# Patient Record
Sex: Female | Born: 1975 | Race: Black or African American | Hispanic: No | Marital: Single | Smoking: Current every day smoker
Health system: Southern US, Community
[De-identification: ages and names within clinical notes are randomized; demographics above are authoritative.]

## PROBLEM LIST (undated history)

## (undated) DIAGNOSIS — J45909 Unspecified asthma, uncomplicated: Secondary | ICD-10-CM

## (undated) DIAGNOSIS — F909 Attention-deficit hyperactivity disorder, unspecified type: Secondary | ICD-10-CM

## (undated) DIAGNOSIS — F419 Anxiety disorder, unspecified: Secondary | ICD-10-CM

## (undated) DIAGNOSIS — K759 Inflammatory liver disease, unspecified: Secondary | ICD-10-CM

## (undated) DIAGNOSIS — B1920 Unspecified viral hepatitis C without hepatic coma: Secondary | ICD-10-CM

## (undated) DIAGNOSIS — F329 Major depressive disorder, single episode, unspecified: Secondary | ICD-10-CM

## (undated) DIAGNOSIS — F32A Depression, unspecified: Secondary | ICD-10-CM

## (undated) DIAGNOSIS — I10 Essential (primary) hypertension: Secondary | ICD-10-CM

---

## 2004-06-28 ENCOUNTER — Emergency Department: Payer: Self-pay | Admitting: Emergency Medicine

## 2004-06-30 ENCOUNTER — Emergency Department: Payer: Self-pay | Admitting: Emergency Medicine

## 2010-03-23 ENCOUNTER — Emergency Department: Payer: Self-pay | Admitting: Emergency Medicine

## 2010-07-30 ENCOUNTER — Encounter: Payer: Self-pay | Admitting: Family Medicine

## 2010-08-20 ENCOUNTER — Encounter: Payer: Self-pay | Admitting: Family Medicine

## 2011-03-09 ENCOUNTER — Emergency Department: Payer: Self-pay | Admitting: Unknown Physician Specialty

## 2012-01-25 ENCOUNTER — Emergency Department: Payer: Self-pay | Admitting: Emergency Medicine

## 2013-03-12 ENCOUNTER — Emergency Department: Payer: Self-pay | Admitting: Emergency Medicine

## 2013-03-12 LAB — URINALYSIS, COMPLETE
Bilirubin,UR: NEGATIVE
Glucose,UR: NEGATIVE mg/dL (ref 0–75)
Leukocyte Esterase: NEGATIVE
Nitrite: NEGATIVE
Ph: 5 (ref 4.5–8.0)
Protein: NEGATIVE
RBC,UR: <1 /HPF (ref 0–5)

## 2013-10-14 ENCOUNTER — Emergency Department: Payer: Self-pay | Admitting: Emergency Medicine

## 2014-05-02 ENCOUNTER — Emergency Department: Payer: Self-pay | Admitting: Internal Medicine

## 2015-07-23 ENCOUNTER — Other Ambulatory Visit: Payer: Self-pay | Admitting: Gastroenterology

## 2015-07-23 DIAGNOSIS — K739 Chronic hepatitis, unspecified: Secondary | ICD-10-CM

## 2015-07-29 ENCOUNTER — Ambulatory Visit: Payer: Medicaid Other | Admitting: Anesthesiology

## 2015-07-31 ENCOUNTER — Ambulatory Visit: Admission: RE | Admit: 2015-07-31 | Payer: Medicaid Other | Source: Ambulatory Visit

## 2015-07-31 ENCOUNTER — Ambulatory Visit
Admission: RE | Admit: 2015-07-31 | Discharge: 2015-07-31 | Disposition: A | Discharge status: Home / Self Care | Payer: Medicaid Other | Source: Ambulatory Visit | Attending: Gastroenterology | Admitting: Gastroenterology

## 2015-07-31 DIAGNOSIS — K739 Chronic hepatitis, unspecified: Secondary | ICD-10-CM | POA: Insufficient documentation

## 2015-07-31 DIAGNOSIS — R932 Abnormal findings on diagnostic imaging of liver and biliary tract: Secondary | ICD-10-CM | POA: Insufficient documentation

## 2015-08-27 ENCOUNTER — Ambulatory Visit: Payer: Medicaid Other | Admitting: Anesthesiology

## 2015-10-03 ENCOUNTER — Encounter: Payer: Self-pay | Admitting: *Deleted

## 2015-10-06 ENCOUNTER — Encounter
Admission: RE | Disposition: A | Discharge status: Home / Self Care | Payer: Self-pay | Source: Ambulatory Visit | Attending: Gastroenterology

## 2015-10-06 ENCOUNTER — Ambulatory Visit: Payer: Medicaid Other | Admitting: Anesthesiology

## 2015-10-06 ENCOUNTER — Encounter: Payer: Self-pay | Admitting: *Deleted

## 2015-10-06 ENCOUNTER — Ambulatory Visit
Admission: RE | Admit: 2015-10-06 | Discharge: 2015-10-06 | Disposition: A | Discharge status: Home / Self Care | Payer: Medicaid Other | Source: Ambulatory Visit | Attending: Gastroenterology | Admitting: Gastroenterology

## 2015-10-06 DIAGNOSIS — F419 Anxiety disorder, unspecified: Secondary | ICD-10-CM | POA: Diagnosis not present

## 2015-10-06 DIAGNOSIS — I10 Essential (primary) hypertension: Secondary | ICD-10-CM | POA: Insufficient documentation

## 2015-10-06 DIAGNOSIS — F329 Major depressive disorder, single episode, unspecified: Secondary | ICD-10-CM | POA: Diagnosis not present

## 2015-10-06 DIAGNOSIS — F909 Attention-deficit hyperactivity disorder, unspecified type: Secondary | ICD-10-CM | POA: Insufficient documentation

## 2015-10-06 DIAGNOSIS — Z79899 Other long term (current) drug therapy: Secondary | ICD-10-CM | POA: Diagnosis not present

## 2015-10-06 DIAGNOSIS — K7460 Unspecified cirrhosis of liver: Secondary | ICD-10-CM | POA: Insufficient documentation

## 2015-10-06 DIAGNOSIS — F17210 Nicotine dependence, cigarettes, uncomplicated: Secondary | ICD-10-CM | POA: Insufficient documentation

## 2015-10-06 HISTORY — PX: ESOPHAGOGASTRODUODENOSCOPY (EGD) WITH PROPOFOL: SHX5813

## 2015-10-06 HISTORY — DX: Anxiety disorder, unspecified: F41.9

## 2015-10-06 HISTORY — DX: Major depressive disorder, single episode, unspecified: F32.9

## 2015-10-06 HISTORY — DX: Depression, unspecified: F32.A

## 2015-10-06 HISTORY — DX: Essential (primary) hypertension: I10

## 2015-10-06 HISTORY — DX: Attention-deficit hyperactivity disorder, unspecified type: F90.9

## 2015-10-06 HISTORY — DX: Inflammatory liver disease, unspecified: K75.9

## 2015-10-06 LAB — POCT PREGNANCY, URINE: PREG TEST UR: NEGATIVE

## 2015-10-06 SURGERY — ESOPHAGOGASTRODUODENOSCOPY (EGD) WITH PROPOFOL
Anesthesia: General

## 2015-10-06 MED ORDER — MIDAZOLAM HCL 2 MG/2ML IJ SOLN
INTRAMUSCULAR | Status: DC | PRN
  Administered 2015-10-06: 1 mg via INTRAVENOUS

## 2015-10-06 MED ORDER — PROPOFOL 10 MG/ML IV BOLUS
INTRAVENOUS | Status: DC | PRN
  Administered 2015-10-06: 40 mg via INTRAVENOUS

## 2015-10-06 MED ORDER — GLYCOPYRROLATE 0.2 MG/ML IJ SOLN
INTRAMUSCULAR | Status: DC | PRN
  Administered 2015-10-06: 0.2 mg via INTRAVENOUS

## 2015-10-06 MED ORDER — PROPOFOL 500 MG/50ML IV EMUL
INTRAVENOUS | Status: DC | PRN
  Administered 2015-10-06: 100 ug/kg/min via INTRAVENOUS

## 2015-10-06 MED ORDER — SODIUM CHLORIDE 0.9 % IV SOLN
INTRAVENOUS | Status: DC
  Administered 2015-10-06: 13:00:00 via INTRAVENOUS

## 2015-10-06 NOTE — Anesthesia Preprocedure Evaluation (Signed)
Anesthesia Evaluation  Patient identified by MRN, date of birth, ID band Patient awake    Reviewed: Allergy & Precautions, H&P , NPO status , Patient's Chart, lab work & pertinent test results, reviewed documented beta blocker date and time   Airway Mallampati: III   Neck ROM: full    Dental  (+) Poor Dentition   Pulmonary neg pulmonary ROS, Current Smoker,    Pulmonary exam normal        Cardiovascular hypertension, negative cardio ROS Normal cardiovascular exam Rate:Normal     Neuro/Psych PSYCHIATRIC DISORDERS negative neurological ROS  negative psych ROS   GI/Hepatic negative GI ROS, Neg liver ROS, (+) Hepatitis -, C  Endo/Other  negative endocrine ROS  Renal/GU negative Renal ROS  negative genitourinary   Musculoskeletal   Abdominal   Peds  Hematology negative hematology ROS (+)   Anesthesia Other Findings Past Medical History:   Anxiety                                                      Depression                                                   Hepatitis                                                    Hypertension                                                 ADHD (attention deficit hyperactivity disorder)            History reviewed. No pertinent surgical history. BMI    Body Mass Index   40.66 kg/m 2     Reproductive/Obstetrics                             Anesthesia Physical Anesthesia Plan  ASA: III  Anesthesia Plan: General   Post-op Pain Management:    Induction:   Airway Management Planned:   Additional Equipment:   Intra-op Plan:   Post-operative Plan:   Informed Consent: I have reviewed the patients History and Physical, chart, labs and discussed the procedure including the risks, benefits and alternatives for the proposed anesthesia with the patient or authorized representative who has indicated his/her understanding and acceptance.   Dental  Advisory Given  Plan Discussed with: CRNA  Anesthesia Plan Comments:         Anesthesia Quick Evaluation

## 2015-10-06 NOTE — Discharge Instructions (Signed)

## 2015-10-06 NOTE — Op Note (Signed)
[facility] Patient Name: Andrea Cain Procedure Date: 10/06/2015 1:38 PM MRN: [id number] Account #: [id number] Date of Birth: 03-22-1976 Admit Type: Outpatient Age: 40 Room: [facility] ENDO ROOM 2 Gender: Female Note Status: Finalized Procedure:            Upper GI endoscopy Indications:          Cirrhosis rule out esophageal varices Patient Profile:      This is a 40 year old female. Providers:            Rhona Raider. Shelle Iron, MD Referring MD:         Malka So. Robyne Askew (Referring MD) Medicines:            Propofol per Anesthesia Complications:        No immediate complications. Procedure:            Pre-Anesthesia Assessment:                       - Prior to the procedure, a History and Physical was                        performed, and patient medications, allergies and                        sensitivities were reviewed. The patient's tolerance of                        previous anesthesia was reviewed.                       After obtaining informed consent, the endoscope was                        passed under direct vision. Throughout the procedure,                        the patient's blood pressure, pulse, and oxygen                        saturations were monitored continuously. The Endoscope                        was introduced through the mouth, and advanced to the                        second part of duodenum. The upper GI endoscopy was                        accomplished without difficulty. The patient tolerated                        the procedure well. Findings:      The esophagus was normal. No esophageal varices.      The stomach was normal.      The examined duodenum was normal. Impression:           - Normal esophagus.                       - Normal stomach.                       -  Normal examined duodenum.                       - No specimens collected. Recommendation:       - Observe patient in GI recovery unit.    - Resume regular diet.                       - Continue present medications.                       - Return to liver clinic.                       - The findings and recommendations were discussed with                        the patient.                       - The findings and recommendations were discussed with                        the patient's family. Procedure Code(s):    --- Professional ---                       [phone] [id number], Esophagogastroduodenoscopy, flexible, transoral;                        diagnostic, including collection of specimen(s) by                        brushing or washing, when performed (separate procedure) Diagnosis Code(s):    --- Professional ---                       K74.60, Unspecified cirrhosis of liver CPT copyright 2016 American Medical Association. All rights reserved. The codes documented in this report are preliminary and upon coder review may  be revised to meet current compliance requirements. Kathalene FramesMatthew G Rein, MD 10/06/2015 1:57:09 PM This report has been signed electronically. Number of Addenda: 0 Note Initiated On: 10/06/2015 1:38 PM      [facility]

## 2015-10-06 NOTE — Transfer of Care (Signed)
Immediate Anesthesia Transfer of Care Note  Patient: Andrea Cain  Procedure(s) Performed: Procedure(s): ESOPHAGOGASTRODUODENOSCOPY (EGD) WITH PROPOFOL (N/A)  Patient Location: PACU  Anesthesia Type:General  Level of Consciousness: awake, alert  and oriented  Airway & Oxygen Therapy: Patient Spontanous Breathing and Patient connected to nasal cannula oxygen  Post-op Assessment: Report given to RN  Post vital signs: Reviewed and stable  Last Vitals:  Filed Vitals:   10/06/15 1303 10/06/15 1358  BP: 138/97 131/84  Pulse: 61 73  Temp: 36.3 C 36.6 C  Resp: 16 16    Complications: No apparent anesthesia complications

## 2015-10-06 NOTE — H&P (Signed)
  Primary Care Physician:  [facility]  Pre-Procedure History & Physical: HPI:  Andrea Cain is a 40 y.o. female is here for an endoscopy.   Past Medical History  Diagnosis Date  . Anxiety   . Depression   . Hepatitis   . Hypertension   . ADHD (attention deficit hyperactivity disorder)     History reviewed. No pertinent past surgical history.  Prior to Admission medications   Medication Sig Start Date End Date Taking? Authorizing Provider  cyclobenzaprine (FLEXERIL) 10 MG tablet Take 10 mg by mouth 3 (three) times daily as needed for muscle spasms.   Yes Historical Provider, MD  traMADol (ULTRAM) 50 MG tablet Take by mouth every 6 (six) hours as needed.   Yes Historical Provider, MD    Allergies as of 09/30/2015  . (Not on File)    History reviewed. No pertinent family history.  Social History   Social History  . Marital Status: Single    Spouse Name: N/A  . Number of Children: N/A  . Years of Education: N/A   Occupational History  . Not on file.   Social History Main Topics  . Smoking status: Current Every Day Smoker    Types: Cigarettes  . Smokeless tobacco: Not on file  . Alcohol Use: 1.8 oz/week    3 Cans of beer per week  . Drug Use: Yes    Special: Marijuana  . Sexual Activity: Not on file   Other Topics Concern  . Not on file   Social History Narrative     Physical Exam: BP 138/97 mmHg  Pulse 61  Temp(Src) 97.4 F (36.3 C) (Tympanic)  Resp 16  Ht 5\' 4"  (1.626 m)  Wt 107.502 kg (237 lb)  BMI 40.66 kg/m2  SpO2 100%  LMP 10/06/2015 General:   Alert,  pleasant and cooperative in NAD Head:  Normocephalic and atraumatic. Neck:  Supple; no masses or thyromegaly. Lungs:  Clear throughout to auscultation.    Heart:  Regular rate and rhythm. Abdomen:  Soft, nontender and nondistended. Normal bowel sounds, without guarding, and without rebound.   Neurologic:  Alert and  oriented x4;  grossly normal  neurologically.  Impression/Plan: Andrea Cain is here for an endoscopy to be performed for cirrhosis, variceal screening  Risks, benefits, limitations, and alternatives regarding  endoscopy have been reviewed with the patient.  Questions have been answered.  All parties agreeable.   Andrea Cain, Andrea Kluender GORDON, MD  10/06/2015, 1:35 PM

## 2015-10-07 NOTE — Anesthesia Postprocedure Evaluation (Signed)
Anesthesia Post Note  Patient: Andrea Cain  Procedure(s) Performed: Procedure(s) (LRB): ESOPHAGOGASTRODUODENOSCOPY (EGD) WITH PROPOFOL (N/A)  Patient location during evaluation: PACU Anesthesia Type: General Level of consciousness: awake and alert Pain management: pain level controlled Vital Signs Assessment: post-procedure vital signs reviewed and stable Respiratory status: spontaneous breathing, nonlabored ventilation, respiratory function stable and patient connected to nasal cannula oxygen Cardiovascular status: blood pressure returned to baseline and stable Postop Assessment: no signs of nausea or vomiting Anesthetic complications: no    Last Vitals:  Filed Vitals:   10/06/15 1418 10/06/15 1428  BP: 150/105 145/99  Pulse: 69 61  Temp:    Resp: 9 15    Last Pain:  Filed Vitals:   10/06/15 1430  PainSc: Asleep                 Yevette EdwardsJames G Adams

## 2015-12-19 ENCOUNTER — Encounter: Payer: Self-pay | Admitting: Emergency Medicine

## 2015-12-19 ENCOUNTER — Emergency Department
Admission: EM | Admit: 2015-12-19 | Discharge: 2015-12-19 | Disposition: A | Discharge status: Home / Self Care | Payer: Medicaid Other | Attending: Emergency Medicine | Admitting: Emergency Medicine

## 2015-12-19 ENCOUNTER — Emergency Department: Payer: Medicaid Other

## 2015-12-19 DIAGNOSIS — F909 Attention-deficit hyperactivity disorder, unspecified type: Secondary | ICD-10-CM | POA: Insufficient documentation

## 2015-12-19 DIAGNOSIS — I10 Essential (primary) hypertension: Secondary | ICD-10-CM | POA: Insufficient documentation

## 2015-12-19 DIAGNOSIS — S61210A Laceration without foreign body of right index finger without damage to nail, initial encounter: Secondary | ICD-10-CM | POA: Diagnosis not present

## 2015-12-19 DIAGNOSIS — Y929 Unspecified place or not applicable: Secondary | ICD-10-CM | POA: Diagnosis not present

## 2015-12-19 DIAGNOSIS — S61250A Open bite of right index finger without damage to nail, initial encounter: Secondary | ICD-10-CM | POA: Insufficient documentation

## 2015-12-19 DIAGNOSIS — W503XXA Accidental bite by another person, initial encounter: Secondary | ICD-10-CM | POA: Insufficient documentation

## 2015-12-19 DIAGNOSIS — S61259A Open bite of unspecified finger without damage to nail, initial encounter: Secondary | ICD-10-CM

## 2015-12-19 DIAGNOSIS — Y939 Activity, unspecified: Secondary | ICD-10-CM | POA: Insufficient documentation

## 2015-12-19 DIAGNOSIS — F17210 Nicotine dependence, cigarettes, uncomplicated: Secondary | ICD-10-CM | POA: Diagnosis not present

## 2015-12-19 DIAGNOSIS — M79644 Pain in right finger(s): Secondary | ICD-10-CM | POA: Diagnosis present

## 2015-12-19 DIAGNOSIS — F1290 Cannabis use, unspecified, uncomplicated: Secondary | ICD-10-CM | POA: Insufficient documentation

## 2015-12-19 DIAGNOSIS — Z79899 Other long term (current) drug therapy: Secondary | ICD-10-CM | POA: Insufficient documentation

## 2015-12-19 DIAGNOSIS — F329 Major depressive disorder, single episode, unspecified: Secondary | ICD-10-CM | POA: Insufficient documentation

## 2015-12-19 DIAGNOSIS — Y999 Unspecified external cause status: Secondary | ICD-10-CM | POA: Insufficient documentation

## 2015-12-19 MED ORDER — IBUPROFEN 600 MG PO TABS: 600.0000 mg | ORAL_TABLET | Freq: Three times a day (TID) | ORAL | Status: DC | PRN

## 2015-12-19 MED ORDER — AMOXICILLIN-POT CLAVULANATE 875-125 MG PO TABS: 1.0000 | ORAL_TABLET | Freq: Two times a day (BID) | ORAL | Status: AC

## 2015-12-19 MED ORDER — TETANUS-DIPHTH-ACELL PERTUSSIS 5-2.5-18.5 LF-MCG/0.5 IM SUSP
0.5000 mL | Freq: Once | INTRAMUSCULAR | Status: AC
  Administered 2015-12-19: 0.5 mL via INTRAMUSCULAR
  Filled 2015-12-19: qty 0.5
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
## 2015-12-19 NOTE — ED Notes (Signed)
See triage note. Left index finger swollen and tender. Open wound noted at joint   Min drainage noted
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
## 2015-12-19 NOTE — ED Notes (Signed)
Left index finger soaked in betadine/saline and dressed with confrom

## 2016-09-02 ENCOUNTER — Other Ambulatory Visit: Payer: Self-pay | Admitting: Student

## 2016-09-02 DIAGNOSIS — K7460 Unspecified cirrhosis of liver: Secondary | ICD-10-CM

## 2016-09-09 ENCOUNTER — Ambulatory Visit
Admission: RE | Admit: 2016-09-09 | Discharge: 2016-09-09 | Disposition: A | Discharge status: Home / Self Care | Payer: Medicaid Other | Source: Ambulatory Visit | Attending: Student | Admitting: Student

## 2016-09-09 DIAGNOSIS — K7460 Unspecified cirrhosis of liver: Secondary | ICD-10-CM | POA: Diagnosis not present

## 2016-09-09 DIAGNOSIS — B182 Chronic viral hepatitis C: Secondary | ICD-10-CM | POA: Insufficient documentation

## 2016-11-09 ENCOUNTER — Encounter: Payer: Self-pay | Admitting: *Deleted

## 2016-11-10 ENCOUNTER — Encounter
Admission: RE | Disposition: A | Discharge status: Home / Self Care | Payer: Self-pay | Source: Ambulatory Visit | Attending: Unknown Physician Specialty

## 2016-11-10 ENCOUNTER — Ambulatory Visit
Admission: RE | Admit: 2016-11-10 | Discharge: 2016-11-10 | Disposition: A | Discharge status: Home / Self Care | Payer: Medicaid Other | Source: Ambulatory Visit | Attending: Unknown Physician Specialty | Admitting: Unknown Physician Specialty

## 2016-11-10 ENCOUNTER — Encounter: Payer: Self-pay | Admitting: Anesthesiology

## 2016-11-10 DIAGNOSIS — Z539 Procedure and treatment not carried out, unspecified reason: Secondary | ICD-10-CM | POA: Insufficient documentation

## 2016-11-10 HISTORY — DX: Unspecified viral hepatitis C without hepatic coma: B19.20

## 2016-11-10 SURGERY — ESOPHAGOGASTRODUODENOSCOPY (EGD) WITH PROPOFOL
Anesthesia: General

## 2016-11-10 MED ORDER — FENTANYL CITRATE (PF) 100 MCG/2ML IJ SOLN
INTRAMUSCULAR | Status: AC
  Filled 2016-11-10: qty 2

## 2016-11-10 MED ORDER — SEVOFLURANE IN SOLN
RESPIRATORY_TRACT | Status: AC
  Filled 2016-11-10: qty 250

## 2016-12-13 ENCOUNTER — Encounter: Payer: Self-pay | Admitting: Emergency Medicine

## 2016-12-13 ENCOUNTER — Emergency Department
Admission: EM | Admit: 2016-12-13 | Discharge: 2016-12-13 | Disposition: A | Discharge status: Home / Self Care | Payer: Medicaid Other | Attending: Emergency Medicine | Admitting: Emergency Medicine

## 2016-12-13 ENCOUNTER — Emergency Department: Payer: Medicaid Other

## 2016-12-13 DIAGNOSIS — M25461 Effusion, right knee: Secondary | ICD-10-CM

## 2016-12-13 DIAGNOSIS — Y929 Unspecified place or not applicable: Secondary | ICD-10-CM | POA: Diagnosis not present

## 2016-12-13 DIAGNOSIS — S8391XA Sprain of unspecified site of right knee, initial encounter: Secondary | ICD-10-CM | POA: Diagnosis not present

## 2016-12-13 DIAGNOSIS — X58XXXA Exposure to other specified factors, initial encounter: Secondary | ICD-10-CM | POA: Diagnosis not present

## 2016-12-13 DIAGNOSIS — Y999 Unspecified external cause status: Secondary | ICD-10-CM | POA: Diagnosis not present

## 2016-12-13 DIAGNOSIS — Y9339 Activity, other involving climbing, rappelling and jumping off: Secondary | ICD-10-CM | POA: Diagnosis not present

## 2016-12-13 DIAGNOSIS — I10 Essential (primary) hypertension: Secondary | ICD-10-CM | POA: Diagnosis not present

## 2016-12-13 DIAGNOSIS — M25561 Pain in right knee: Secondary | ICD-10-CM | POA: Diagnosis present

## 2016-12-13 MED ORDER — TRAMADOL HCL 50 MG PO TABS: 50.0000 mg | ORAL_TABLET | Freq: Four times a day (QID) | ORAL | 0 refills | Status: AC | PRN

## 2016-12-13 MED ORDER — TRAMADOL HCL 50 MG PO TABS
50.0000 mg | ORAL_TABLET | Freq: Once | ORAL | Status: AC
  Administered 2016-12-13: 50 mg via ORAL
  Filled 2016-12-13: qty 1

## 2016-12-13 MED ORDER — PREDNISONE 10 MG (21) PO TBPK: ORAL_TABLET | ORAL | 0 refills | Status: DC

## 2016-12-13 MED ORDER — PREDNISONE 20 MG PO TABS
60.0000 mg | ORAL_TABLET | Freq: Once | ORAL | Status: AC
  Administered 2016-12-13: 60 mg via ORAL
  Filled 2016-12-13: qty 3

## 2016-12-13 NOTE — ED Triage Notes (Signed)
Pain R knee since jumped off porch 3 days ago. Knee swollen.

## 2016-12-13 NOTE — ED Notes (Signed)
AAOx3.  Skin warm and dry.  NAD 

## 2016-12-13 NOTE — ED Provider Notes (Signed)
[facility] Emergency Department Provider Note   ____________________________________________   I have reviewed the triage vital signs and the nursing notes.   HISTORY  Chief Complaint Knee Pain    HPI Andrea Cain is a 41 y.o. female presented with right knee pain and swelling secondary to jumping off a porch 3 days ago. Patient reported she jumped off of a porch stating she felt like she landed fine and when she attempted to walk she noted immediate pain. Over the last 3 days progressive pain and swelling causing increasing difficulty with ambulation. Patient describes pain along the anterior, posterior and medial aspect of the knee. Patient describes any past significant injury of the knee however reports osteoarthritis. Patient denies fever, chills, headache, vision changes, chest pain, chest tightness, shortness of breath, abdominal pain, nausea and vomiting.  Past Medical History:  Diagnosis Date  . ADHD (attention deficit hyperactivity disorder)   . Anxiety   . Depression   . Hepatitis   . Hepatitis C   . Hypertension     There are no active problems to display for this patient.   Past Surgical History:  Procedure Laterality Date  . ESOPHAGOGASTRODUODENOSCOPY (EGD) WITH PROPOFOL N/A 10/06/2015   Procedure: ESOPHAGOGASTRODUODENOSCOPY (EGD) WITH PROPOFOL;  Surgeon: Elnita Maxwell, MD;  Location: [facility] ENDOSCOPY;  Service: Endoscopy;  Laterality: N/A;    Prior to Admission medications   Medication Sig Start Date End Date Taking? Authorizing Provider  cyclobenzaprine (FLEXERIL) 10 MG tablet Take 10 mg by mouth as directed.    [provider]  ibuprofen (ADVIL,MOTRIN) 600 MG tablet Take 1 tablet (600 mg total) by mouth every 8 (eight) hours as needed. 12/19/15   Bridget Hartshorn L, PA-C  Ledipasvir-Sofosbuvir (HARVONI) 90-400 MG TABS Take by mouth.    [provider]  predniSONE (STERAPRED UNI-PAK 21 TAB) 10 MG (21) TBPK  tablet Take 6 tablets on day 1. Take 5 tablets on day 2. Take 4 tablets on day 3. Take 3 tablets on day 4. Take 2 tablets on day 5. Take 1 tablets on day 6. 12/13/16   Vera Wishart M, PA-C  traMADol (ULTRAM) 50 MG tablet Take 1 tablet (50 mg total) by mouth every 6 (six) hours as needed. 12/13/16 12/13/17  Garald Rhew M, PA-C    Allergies Codeine  No family history on file.  Social History Social History  Substance Use Topics  . Smoking status: Current Every Day Smoker    Types: Cigarettes  . Smokeless tobacco: Never Used  . Alcohol use 1.8 oz/week    3 Cans of beer per week    Review of Systems Constitutional: Negative for fever/chills Eyes: No visual changes. ENT:  Negative for sore throat and for difficulty swallowing Cardiovascular: Denies chest pain. Respiratory: Denies cough Denies shortness of breath. Musculoskeletal: Right knee pain and swelling Skin: Negative for rash. Neurological: Negative for headaches.  Negative focal weakness or numbness. Negative for loss of consciousness. Pain with ambulation ____________________________________________   PHYSICAL EXAM:  VITAL SIGNS: ED Triage Vitals  Enc Vitals Group     BP 12/13/16 1152 (!) 156/102     Pulse Rate 12/13/16 1148 84     Resp 12/13/16 1148 20     Temp 12/13/16 1148 98.4 F (36.9 C)     Temp Source 12/13/16 1148 Oral     SpO2 12/13/16 1148 99 %     Weight 12/13/16 1150 228 lb (103.4 kg)     Height 12/13/16 1150 5'  4" (1.626 m)     Head Circumference --      Peak Flow --      Pain Score 12/13/16 1148 10     Pain Loc --      Pain Edu? --      Excl. in GC? --     Constitutional: Alert and oriented. Well appearing and in no acute distress.  Head: Normocephalic and atraumatic. Eyes: Conjunctivae are normal. PERRL.  Cardiovascular: Normal rate, regular rhythm. Normal distal pulses. Respiratory: Normal respiratory effort.  Musculoskeletal: Right knee range of motion limited by effusion and pain however  able to complete range. Right lower extremity sensation intact. Palate all tenderness noted along the medial joint line, anterior aspect of the joint line and globally the posterior aspect of the knee. Anterior and posterior drawer negative. Increased pain however no laxity noticed on valgus stressing. Unable to fully extend the knee without significant pain. Nontender with normal range of motion in all extremities. Neurologic: Normal speech and language. No gross focal neurologic deficits are appreciated. No gait instability.  Skin:  Skin is warm, dry and intact. No rash noted. Psychiatric: Mood and affect are normal.  ____________________________________________   LABS (all labs ordered are listed, but only abnormal results are displayed)  Labs Reviewed - No data to display ____________________________________________  EKG None ____________________________________________  RADIOLOGY DG right knee complete FINDINGS: The bones are subjectively adequately mineralized. There is no acute fracture or dislocation. The joint spaces are well maintained. There is no suprapatellar effusion.  IMPRESSION: There is no acute or significant chronic bony abnormality of the right knee. ____________________________________________   PROCEDURES  Procedure(s) performed: No    Critical Care performed: no ____________________________________________   INITIAL IMPRESSION / ASSESSMENT AND PLAN / ED COURSE  Pertinent labs & imaging results that were available during my care of the patient were reviewed by me and considered in my medical decision making (see chart for details).  Patient presented with right knee pain and effusion as result of a jumping injury 3 days ago. History, physical exam and imaging are reassuring of no acute fracture. Symptoms are likely associated with a knee joint sprain injury. Patient noted reduction of pain following prednisone 60 mg and tramadol 50 mg provided during  her course of care in emergency department today. Patient will be discharged with a prednisone taper and tramadol to be taken as needed. Highly encourage patient to transition to over-the-counter NSAIDs for symptom relief as needed. Patient will be given a knee immobilizer and crutches to support the knee until symptoms improve. Patient has a scheduled appointment with orthopedics for continued care. Patient informed of clinical course, understand medical decision-making process, and agree with plan.  Patient was advised to follow up with PCP as needed and was also advised to return to the emergency department for symptoms that change or worsen.      If controlled substance prescribed during this visit, 12 month history viewed on the [facility] prior to issuing an initial prescription for Schedule II or III opiod. ____________________________________________   FINAL CLINICAL IMPRESSION(S) / ED DIAGNOSES  Final diagnoses:  Sprain of right knee, unspecified ligament, initial encounter  Effusion of right knee       NEW MEDICATIONS STARTED DURING THIS VISIT:  Discharge Medication List as of 12/13/2016  1:20 PM    START taking these medications   Details  predniSONE (STERAPRED UNI-PAK 21 TAB) 10 MG (21) TBPK tablet Take 6 tablets on day 1. Take 5 tablets on  day 2. Take 4 tablets on day 3. Take 3 tablets on day 4. Take 2 tablets on day 5. Take 1 tablets on day 6., Print         Note:  This document was prepared using Dragon voice recognition software and may include unintentional dictation errors.    Clois Comber, PA-C 12/13/16 1457    Merrily Brittle, MD 12/13/16 1558

## 2017-01-18 ENCOUNTER — Encounter: Payer: Self-pay | Admitting: *Deleted

## 2017-01-19 ENCOUNTER — Ambulatory Visit: Payer: Medicaid Other | Admitting: Anesthesiology

## 2017-01-19 ENCOUNTER — Encounter
Admission: RE | Disposition: A | Discharge status: Home / Self Care | Payer: Self-pay | Source: Ambulatory Visit | Attending: Unknown Physician Specialty

## 2017-01-19 ENCOUNTER — Ambulatory Visit
Admission: RE | Admit: 2017-01-19 | Discharge: 2017-01-19 | Disposition: A | Discharge status: Home / Self Care | Payer: Medicaid Other | Source: Ambulatory Visit | Attending: Unknown Physician Specialty | Admitting: Unknown Physician Specialty

## 2017-01-19 ENCOUNTER — Encounter: Payer: Self-pay | Admitting: *Deleted

## 2017-01-19 DIAGNOSIS — F17200 Nicotine dependence, unspecified, uncomplicated: Secondary | ICD-10-CM | POA: Diagnosis not present

## 2017-01-19 DIAGNOSIS — I10 Essential (primary) hypertension: Secondary | ICD-10-CM | POA: Insufficient documentation

## 2017-01-19 DIAGNOSIS — B1920 Unspecified viral hepatitis C without hepatic coma: Secondary | ICD-10-CM | POA: Insufficient documentation

## 2017-01-19 DIAGNOSIS — Z538 Procedure and treatment not carried out for other reasons: Secondary | ICD-10-CM | POA: Insufficient documentation

## 2017-01-19 DIAGNOSIS — K7460 Unspecified cirrhosis of liver: Secondary | ICD-10-CM | POA: Insufficient documentation

## 2017-01-19 LAB — URINE DRUG SCREEN, QUALITATIVE (ARMC ONLY)
AMPHETAMINES, UR SCREEN: NOT DETECTED
BENZODIAZEPINE, UR SCRN: NOT DETECTED
Barbiturates, Ur Screen: NOT DETECTED
CANNABINOID 50 NG, UR ~~LOC~~: POSITIVE — AB
Cocaine Metabolite,Ur ~~LOC~~: POSITIVE — AB
MDMA (Ecstasy)Ur Screen: NOT DETECTED
Methadone Scn, Ur: NOT DETECTED
OPIATE, UR SCREEN: NOT DETECTED
PHENCYCLIDINE (PCP) UR S: NOT DETECTED
Tricyclic, Ur Screen: NOT DETECTED

## 2017-01-19 LAB — POCT PREGNANCY, URINE: Preg Test, Ur: NEGATIVE

## 2017-01-19 SURGERY — ESOPHAGOGASTRODUODENOSCOPY (EGD) WITH PROPOFOL
Anesthesia: General

## 2017-01-19 MED ORDER — FENTANYL CITRATE (PF) 100 MCG/2ML IJ SOLN
INTRAMUSCULAR | Status: AC
  Filled 2017-01-19: qty 2

## 2017-01-19 MED ORDER — SODIUM CHLORIDE 0.9 % IV SOLN: INTRAVENOUS | Status: DC

## 2017-01-19 MED ORDER — PROPOFOL 500 MG/50ML IV EMUL
INTRAVENOUS | Status: AC
  Filled 2017-01-19: qty 50

## 2017-01-19 MED ORDER — MIDAZOLAM HCL 2 MG/2ML IJ SOLN
INTRAMUSCULAR | Status: AC
  Filled 2017-01-19: qty 2

## 2017-01-19 MED ORDER — GLYCOPYRROLATE 0.2 MG/ML IJ SOLN
INTRAMUSCULAR | Status: AC
  Filled 2017-01-19: qty 1

## 2017-01-19 NOTE — Brief Op Note (Signed)
Positive urine drug screen for cocaine and cannabanoid. Dr. Maisie Fushomas and Dr. Mechele CollinElliott informed. Case cancelled, pt left premises.

## 2017-01-19 NOTE — Anesthesia Preprocedure Evaluation (Signed)
Anesthesia Evaluation  Patient identified by MRN, date of birth, ID band Patient awake    Reviewed: Allergy & Precautions, NPO status , Patient's Chart, lab work & pertinent test results, reviewed documented beta blocker date and time   Airway Mallampati: III  TM Distance: >3 FB     Dental  (+) Chipped   Pulmonary Current Smoker,           Cardiovascular hypertension, Pt. on medications      Neuro/Psych    GI/Hepatic (+) Cirrhosis       , Hepatitis -, C  Endo/Other  Morbid obesity  Renal/GU      Musculoskeletal   Abdominal   Peds  Hematology   Anesthesia Other Findings ADHD.  Reproductive/Obstetrics                             Anesthesia Physical Anesthesia Plan  ASA: III  Anesthesia Plan: General   Post-op Pain Management:    Induction: Intravenous  PONV Risk Score and Plan:   Airway Management Planned:   Additional Equipment:   Intra-op Plan:   Post-operative Plan:   Informed Consent: I have reviewed the patients History and Physical, chart, labs and discussed the procedure including the risks, benefits and alternatives for the proposed anesthesia with the patient or authorized representative who has indicated his/her understanding and acceptance.     Plan Discussed with: CRNA  Anesthesia Plan Comments:         Anesthesia Quick Evaluation

## 2018-09-10 ENCOUNTER — Emergency Department
Admission: EM | Admit: 2018-09-10 | Discharge: 2018-09-10 | Disposition: A | Discharge status: Home / Self Care | Payer: Medicaid Other | Attending: Emergency Medicine | Admitting: Emergency Medicine

## 2018-09-10 ENCOUNTER — Other Ambulatory Visit: Payer: Self-pay

## 2018-09-10 ENCOUNTER — Encounter: Payer: Self-pay | Admitting: Emergency Medicine

## 2018-09-10 DIAGNOSIS — F17210 Nicotine dependence, cigarettes, uncomplicated: Secondary | ICD-10-CM | POA: Diagnosis not present

## 2018-09-10 DIAGNOSIS — I10 Essential (primary) hypertension: Secondary | ICD-10-CM | POA: Diagnosis not present

## 2018-09-10 DIAGNOSIS — R030 Elevated blood-pressure reading, without diagnosis of hypertension: Secondary | ICD-10-CM

## 2018-09-10 DIAGNOSIS — R05 Cough: Secondary | ICD-10-CM | POA: Diagnosis present

## 2018-09-10 DIAGNOSIS — J4521 Mild intermittent asthma with (acute) exacerbation: Secondary | ICD-10-CM | POA: Diagnosis not present

## 2018-09-10 DIAGNOSIS — Z79899 Other long term (current) drug therapy: Secondary | ICD-10-CM | POA: Diagnosis not present

## 2018-09-10 MED ORDER — PREDNISONE 10 MG PO TABS: ORAL_TABLET | ORAL | 0 refills | Status: DC

## 2018-09-10 MED ORDER — ALBUTEROL SULFATE HFA 108 (90 BASE) MCG/ACT IN AERS: 2.0000 | INHALATION_SPRAY | Freq: Four times a day (QID) | RESPIRATORY_TRACT | 2 refills | Status: AC | PRN

## 2018-09-10 MED ORDER — IPRATROPIUM-ALBUTEROL 0.5-2.5 (3) MG/3ML IN SOLN
3.0000 mL | Freq: Once | RESPIRATORY_TRACT | Status: AC
  Administered 2018-09-10: 3 mL via RESPIRATORY_TRACT
  Filled 2018-09-10: qty 3

## 2018-09-10 NOTE — ED Provider Notes (Signed)
[facility] Emergency Department Provider Note   ____________________________________________   First MD Initiated Contact with Patient 09/10/18 1025     (approximate)  I have reviewed the triage vital signs and the nursing notes.   HISTORY  Chief Complaint Cough   HPI Andrea Cain is a 43 y.o. female presents to the ED with complaint of productive cough x1 week.  Patient states that she has history of bronchitis and asthma and has been using her inhaler at home which she ran out of last evening.  In the past she has had to take prednisone due to her asthma.  She denies any fever or chills.  There is been no nausea or vomiting.  Patient does continue to smoke 1/2 pack cigarettes per day.  Patient states that she also has not taken her blood pressure medication and 1 year.  She rates her pain from coughing as a 10 out of 10.     Past Medical History:  Diagnosis Date  . ADHD (attention deficit hyperactivity disorder)   . Anxiety   . Depression   . Hepatitis   . Hepatitis C   . Hypertension     There are no active problems to display for this patient.   Past Surgical History:  Procedure Laterality Date  . ESOPHAGOGASTRODUODENOSCOPY (EGD) WITH PROPOFOL N/A 10/06/2015   Procedure: ESOPHAGOGASTRODUODENOSCOPY (EGD) WITH PROPOFOL;  Surgeon: Elnita Maxwell, MD;  Location: [facility] ENDOSCOPY;  Service: Endoscopy;  Laterality: N/A;    Prior to Admission medications   Medication Sig Start Date End Date Taking? Authorizing Provider  albuterol (PROVENTIL HFA;VENTOLIN HFA) 108 (90 Base) MCG/ACT inhaler Inhale 2 puffs into the lungs every 6 (six) hours as needed for wheezing or shortness of breath. 09/10/18   Bridget Hartshorn L, PA-C  cyclobenzaprine (FLEXERIL) 10 MG tablet Take 10 mg by mouth as directed.    [provider]  naproxen (NAPROSYN) 250 MG tablet Take by mouth 2 (two) times daily with a meal.    [provider]  predniSONE  (DELTASONE) 10 MG tablet Take 3 tablets once a day until finished 09/10/18   Bridget Hartshorn L, PA-C    Allergies Codeine  History reviewed. No pertinent family history.  Social History Social History   Tobacco Use  . Smoking status: Current Every Day Smoker    Packs/day: 0.50    Types: Cigarettes  . Smokeless tobacco: Never Used  Substance Use Topics  . Alcohol use: Yes    Alcohol/week: 12.0 standard drinks    Types: 12 Cans of beer per week    Comment: 2 40's almost every day  . Drug use: Yes    Types: Marijuana, Cocaine    Comment: marijuana daily, cocaine sometimes    Review of Systems Constitutional: No fever/chills Eyes: No visual changes. ENT: No sore throat.  Negative for ear pain. Cardiovascular: Denies chest pain. Respiratory: Denies shortness of breath.  Positive for productive cough. Gastrointestinal: No abdominal pain.  No nausea, no vomiting.   Musculoskeletal: Negative for muscle aches. Skin: Negative for rash. Neurological: Negative for  focal weakness or numbness. ___________________________________________   PHYSICAL EXAM:  VITAL SIGNS: ED Triage Vitals  Enc Vitals Group     BP 09/10/18 1017 (!) 168/119     Pulse Rate 09/10/18 1017 84     Resp 09/10/18 1017 20     Temp 09/10/18 1017 98.6 F (37 C)     Temp Source 09/10/18 1017 Oral     SpO2  09/10/18 1017 99 %     Weight 09/10/18 1018 250 lb (113.4 kg)     Height 09/10/18 1018 5\' 4"  (1.626 m)     Head Circumference --      Peak Flow --      Pain Score 09/10/18 1017 10     Pain Loc --      Pain Edu? --      Excl. in GC? --    Constitutional: Alert and oriented. Well appearing and in no acute distress. Eyes: Conjunctivae are normal.  Head: Atraumatic. Nose: No congestion/rhinnorhea.  TMs are clear bilaterally. Mouth/Throat: Mucous membranes are moist.  Oropharynx non-erythematous. Neck: No stridor.   Hematological/Lymphatic/Immunilogical: No cervical lymphadenopathy. Cardiovascular:  Normal rate, regular rhythm. Grossly normal heart sounds.  Good peripheral circulation. Respiratory: Normal respiratory effort.  No retractions. Lungs mild expiratory wheeze heard bilaterally. Musculoskeletal: Moves upper and lower extremities without any difficulty.  Normal gait was noted. Neurologic:  Normal speech and language. No gross focal neurologic deficits are appreciated.  Skin:  Skin is warm, dry and intact. No rash noted. Psychiatric: Mood and affect are normal. Speech and behavior are normal.  ____________________________________________   LABS (all labs ordered are listed, but only abnormal results are displayed)  Labs Reviewed - No data to display   PROCEDURES  Procedure(s) performed (including Critical Care):  Procedures   ____________________________________________   INITIAL IMPRESSION / ASSESSMENT AND PLAN / ED COURSE  As part of my medical decision making, I reviewed the following data within the electronic MEDICAL RECORD NUMBER Notes from prior ED visits and [place] Controlled Substance Database  Patient presents to the ED with complaint of cough and wheezing for approximately 1 week.  Patient states she has history of bronchitis and asthma and this feels very similar to her past experiences.  Patient denies any fever or chills.  There is been no over-the-counter medication used and she currently does not have an inhaler.  Patient continues to smoke daily.  She is able to walk and talk without any respiratory difficulties.  Patient was given a nebulizer treatment while in the ED which completely cleared her wheezing that was heard earlier.  Patient was given a short course of prednisone and also an inhaler prescription.  She was also encouraged to follow-up with her PCP at [facility] to have her blood pressure rechecked.  Patient took herself off the blood pressure medication 1 year ago.  ____________________________________________   FINAL CLINICAL IMPRESSION(S)  / ED DIAGNOSES  Final diagnoses:  Mild intermittent asthma with exacerbation  Elevated blood pressure reading     ED Discharge Orders         Ordered    albuterol (PROVENTIL HFA;VENTOLIN HFA) 108 (90 Base) MCG/ACT inhaler  Every 6 hours PRN     09/10/18 1130    predniSONE (DELTASONE) 10 MG tablet     09/10/18 1130           Note:  This document was prepared using Dragon voice recognition software and may include unintentional dictation errors.    Tommi Rumps, PA-C 09/10/18 1506    Nita Sickle, MD 09/11/18 [phone]

## 2018-09-10 NOTE — ED Triage Notes (Addendum)
FIRST NURSE NOTE-pt here for cough X 1 week that is productive. Denies fever. C/o pain in lungs.  Ambulatory. Mask applied.

## 2018-09-10 NOTE — Discharge Instructions (Addendum)
Follow-up with your primary care provider at [facility]. You also need to have your blood pressure checked by your provider at [facility] as there are many blood pressure medications that are on the $4 list at [facility].  Today her blood pressure was 168/119.  Begin taking prednisone as directed and use your albuterol inhaler as needed for coughing and shortness of breath.

## 2018-09-10 NOTE — ED Triage Notes (Signed)
Pt presents via pov from home with productive cough x 1.5 weeks. Pt has hx of bronchitis and asthma. She reports yellow phlegm. Pt alert & oriented with NAD noted.

## 2018-09-29 IMAGING — DX DG KNEE COMPLETE 4+V*R*
4 series · 4 of 4 positions shown · non-contrast
Comparison: None in PACs

CLINICAL DATA: Right knee pain since jumping from a porch 3 days
ago. Patient reports generalized swelling.

EXAM:
RIGHT KNEE - COMPLETE 4+ VIEW

[knee ap]
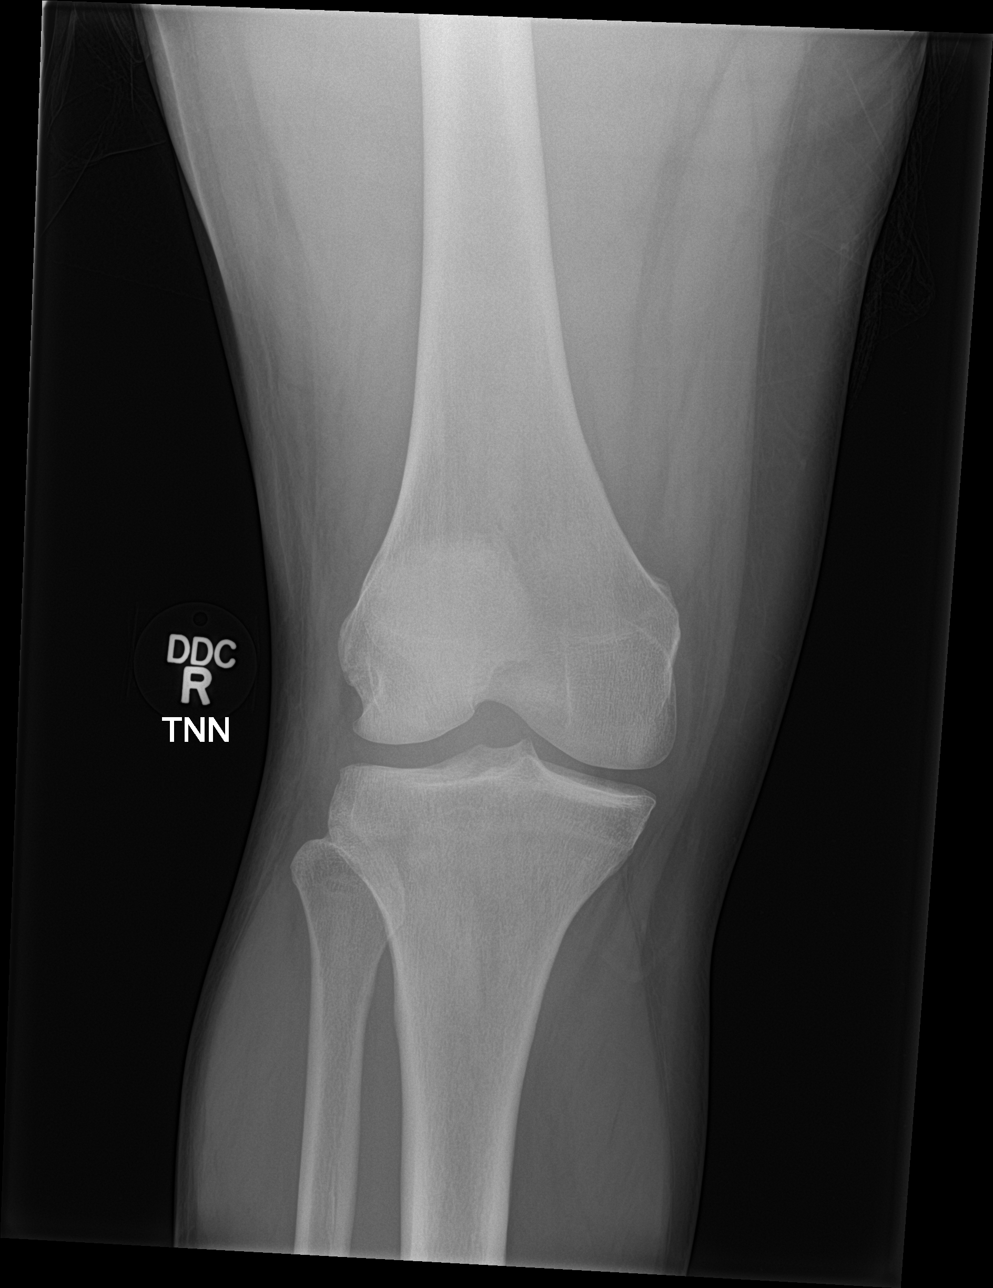

[knee lat]
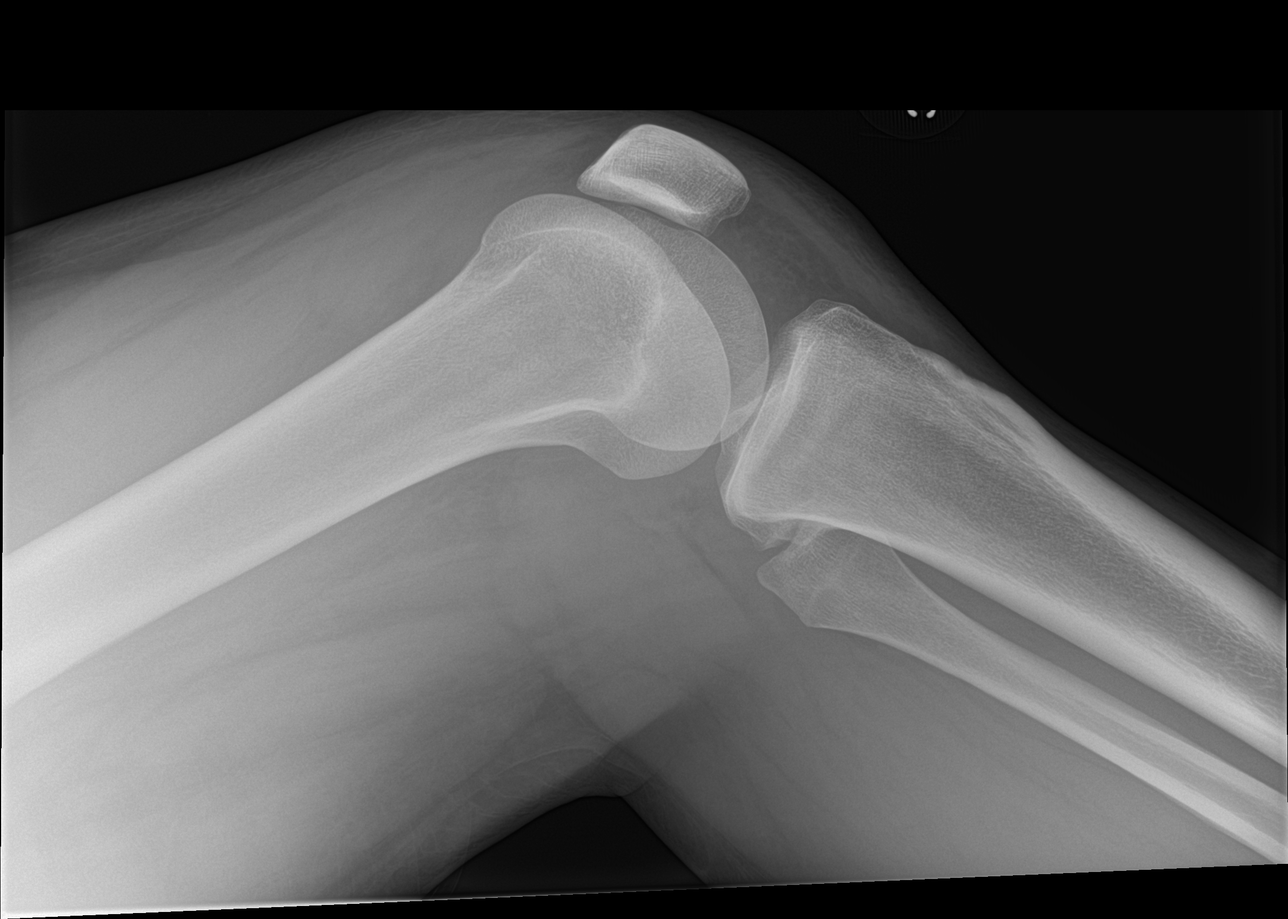

[knee obl (1 of 2)]
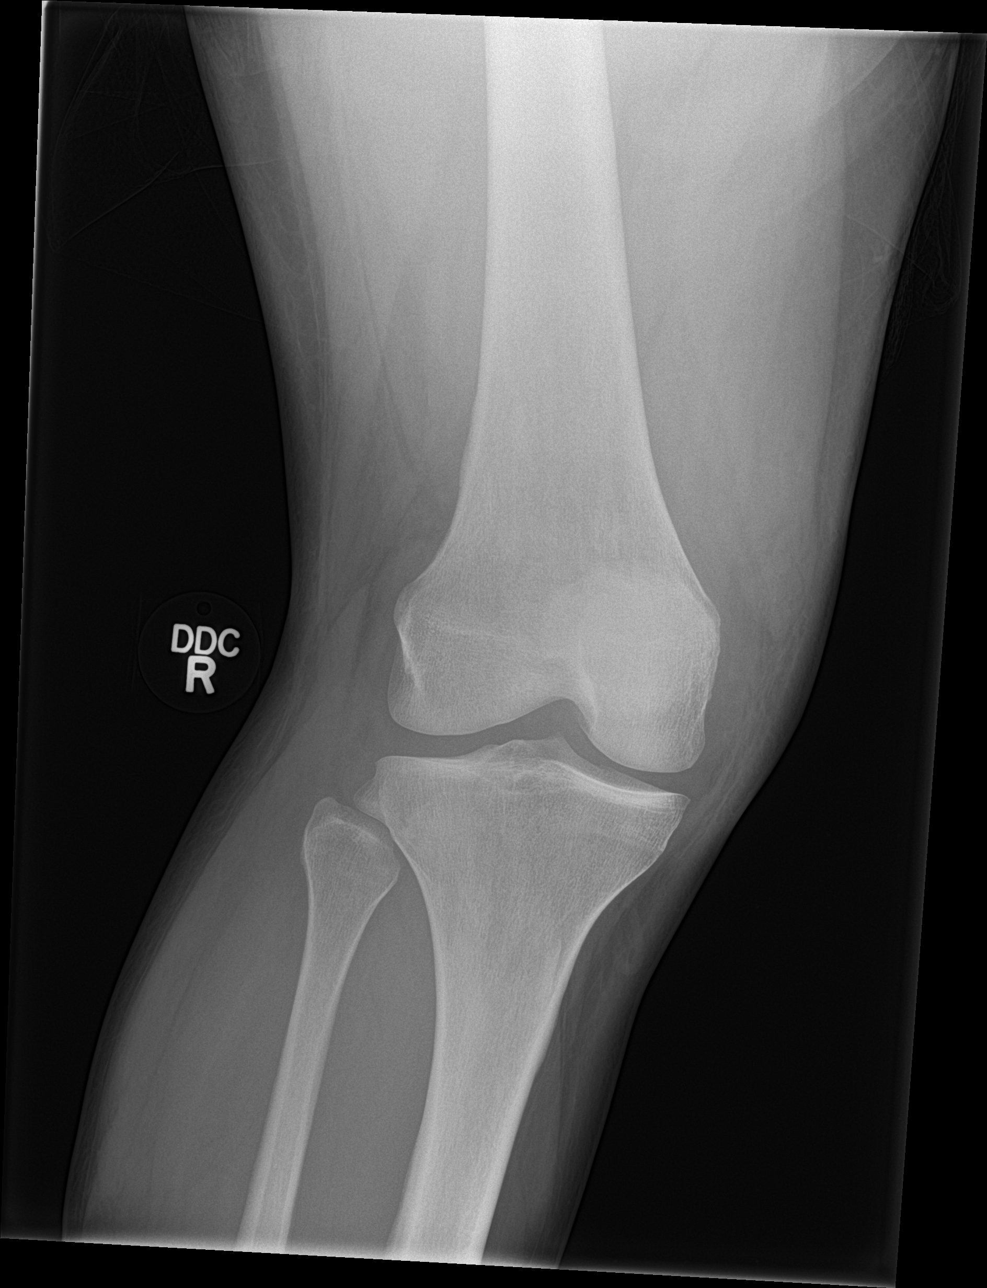

[knee obl (2 of 2)]
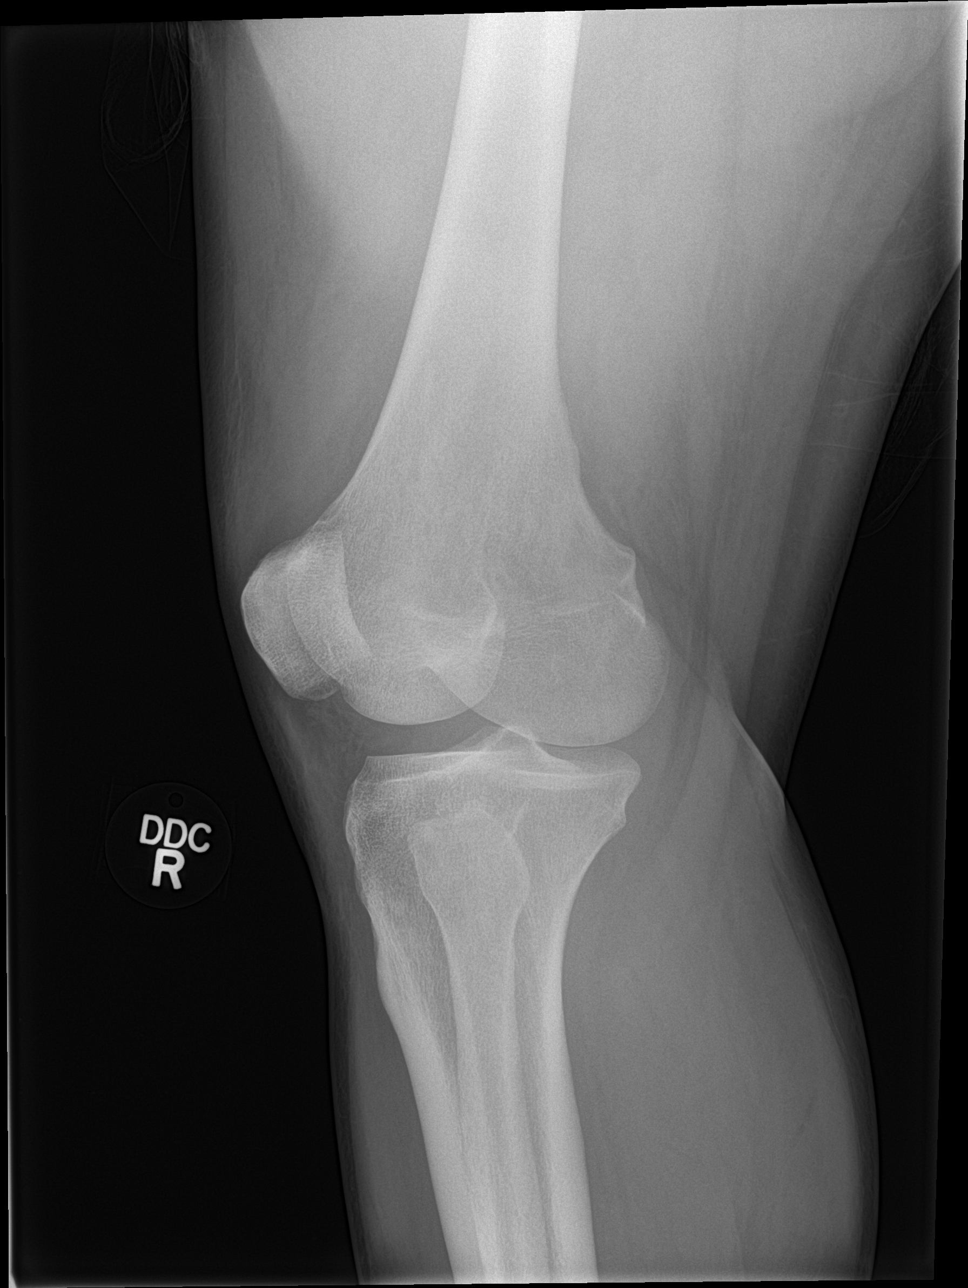

[4 of 4 positions shown; findings below may reference images not displayed]

FINDINGS: The bones are subjectively adequately mineralized. There is no acute
fracture or dislocation. The joint spaces are well maintained. There
is no suprapatellar effusion.
IMPRESSION: There is no acute or significant chronic bony abnormality of the
right knee.

## 2019-09-08 ENCOUNTER — Emergency Department
Admission: EM | Admit: 2019-09-08 | Discharge: 2019-09-08 | Disposition: A | Discharge status: Home / Self Care | Payer: Medicaid Other | Attending: Emergency Medicine | Admitting: Emergency Medicine

## 2019-09-08 ENCOUNTER — Other Ambulatory Visit: Payer: Self-pay

## 2019-09-08 DIAGNOSIS — F419 Anxiety disorder, unspecified: Secondary | ICD-10-CM | POA: Insufficient documentation

## 2019-09-08 DIAGNOSIS — F17210 Nicotine dependence, cigarettes, uncomplicated: Secondary | ICD-10-CM | POA: Diagnosis not present

## 2019-09-08 DIAGNOSIS — R062 Wheezing: Secondary | ICD-10-CM | POA: Insufficient documentation

## 2019-09-08 DIAGNOSIS — I10 Essential (primary) hypertension: Secondary | ICD-10-CM | POA: Insufficient documentation

## 2019-09-08 DIAGNOSIS — R4689 Other symptoms and signs involving appearance and behavior: Secondary | ICD-10-CM

## 2019-09-08 DIAGNOSIS — F32A Depression, unspecified: Secondary | ICD-10-CM | POA: Insufficient documentation

## 2019-09-08 DIAGNOSIS — F1490 Cocaine use, unspecified, uncomplicated: Secondary | ICD-10-CM | POA: Diagnosis not present

## 2019-09-08 DIAGNOSIS — Z79899 Other long term (current) drug therapy: Secondary | ICD-10-CM | POA: Insufficient documentation

## 2019-09-08 DIAGNOSIS — F1290 Cannabis use, unspecified, uncomplicated: Secondary | ICD-10-CM | POA: Insufficient documentation

## 2019-09-08 DIAGNOSIS — R451 Restlessness and agitation: Secondary | ICD-10-CM | POA: Diagnosis present

## 2019-09-08 DIAGNOSIS — F329 Major depressive disorder, single episode, unspecified: Secondary | ICD-10-CM | POA: Insufficient documentation

## 2019-09-08 LAB — URINALYSIS, COMPLETE (UACMP) WITH MICROSCOPIC
Bilirubin Urine: NEGATIVE
Glucose, UA: NEGATIVE mg/dL
Hgb urine dipstick: NEGATIVE
Ketones, ur: NEGATIVE mg/dL
Leukocytes,Ua: NEGATIVE
Nitrite: NEGATIVE
Protein, ur: 30 mg/dL — AB
Specific Gravity, Urine: 1.014 (ref 1.005–1.030)
pH: 5 (ref 5.0–8.0)

## 2019-09-08 LAB — SALICYLATE LEVEL: Salicylate Lvl: <7 mg/dL — ABNORMAL LOW (ref 7.0–30.0)

## 2019-09-08 LAB — COMPREHENSIVE METABOLIC PANEL
ALT: 37 U/L (ref 0–44)
AST: 40 U/L (ref 15–41)
Albumin: 4.5 g/dL (ref 3.5–5.0)
Alkaline Phosphatase: 62 U/L (ref 38–126)
Anion gap: 13 (ref 5–15)
BUN: 10 mg/dL (ref 6–20)
CO2: 21 mmol/L — ABNORMAL LOW (ref 22–32)
Calcium: 8.8 mg/dL — ABNORMAL LOW (ref 8.9–10.3)
Chloride: 105 mmol/L (ref 98–111)
Creatinine, Ser: 1.23 mg/dL — ABNORMAL HIGH (ref 0.44–1.00)
GFR calc Af Amer: >60 mL/min (ref >60)
GFR calc non Af Amer: 54 mL/min — ABNORMAL LOW (ref >60)
Glucose, Bld: 178 mg/dL — ABNORMAL HIGH (ref 70–99)
Potassium: 3.4 mmol/L — ABNORMAL LOW (ref 3.5–5.1)
Sodium: 139 mmol/L (ref 135–145)
Total Bilirubin: 0.6 mg/dL (ref 0.3–1.2)
Total Protein: 8 g/dL (ref 6.5–8.1)

## 2019-09-08 LAB — URINE DRUG SCREEN, QUALITATIVE (ARMC ONLY)
Amphetamines, Ur Screen: NOT DETECTED
Barbiturates, Ur Screen: NOT DETECTED
Benzodiazepine, Ur Scrn: NOT DETECTED
Cannabinoid 50 Ng, Ur ~~LOC~~: POSITIVE — AB
Cocaine Metabolite,Ur ~~LOC~~: POSITIVE — AB
MDMA (Ecstasy)Ur Screen: NOT DETECTED
Methadone Scn, Ur: NOT DETECTED
Opiate, Ur Screen: NOT DETECTED
Phencyclidine (PCP) Ur S: NOT DETECTED
Tricyclic, Ur Screen: NOT DETECTED

## 2019-09-08 LAB — CBC
HCT: 41.7 % (ref 36.0–46.0)
Hemoglobin: 14.2 g/dL (ref 12.0–15.0)
MCH: 33.6 pg (ref 26.0–34.0)
MCHC: 34.1 g/dL (ref 30.0–36.0)
MCV: 98.8 fL (ref 80.0–100.0)
Platelets: 326 10*3/uL (ref 150–400)
RBC: 4.22 MIL/uL (ref 3.87–5.11)
RDW: 12.8 % (ref 11.5–15.5)
WBC: 7.8 10*3/uL (ref 4.0–10.5)
nRBC: 0 % (ref 0.0–0.2)

## 2019-09-08 LAB — POCT PREGNANCY, URINE: Preg Test, Ur: NEGATIVE

## 2019-09-08 LAB — ACETAMINOPHEN LEVEL: Acetaminophen (Tylenol), Serum: <10 ug/mL — ABNORMAL LOW (ref 10–30)

## 2019-09-08 LAB — ETHANOL: Alcohol, Ethyl (B): 148 mg/dL — ABNORMAL HIGH (ref <10)

## 2019-09-08 MED ORDER — PREDNISONE 20 MG PO TABS
60.0000 mg | ORAL_TABLET | Freq: Once | ORAL | Status: AC
  Administered 2019-09-08: 60 mg via ORAL
  Filled 2019-09-08: qty 3

## 2019-09-08 MED ORDER — IPRATROPIUM-ALBUTEROL 0.5-2.5 (3) MG/3ML IN SOLN
RESPIRATORY_TRACT | Status: AC
  Administered 2019-09-08: 3 mL
  Filled 2019-09-08: qty 3

## 2019-09-08 NOTE — ED Provider Notes (Signed)
[facility] Emergency Department Provider Note   ____________________________________________   First MD Initiated Contact with Patient 09/08/19 0147     (approximate)  I have reviewed the triage vital signs and the nursing notes.   HISTORY  Chief Complaint Agitation  Level V caveat: Limited by agitation  HPI Andrea Cain is a 44 y.o. female brought to the ED from group home with BPD under IVC for SI and banging her head into the wall at the group home.  No reports of LOC.  Patient has a history of ADHD, anxiety/depression, hypertension and asthma.  Arrives to the ED agitated, crying with wheezing.  Rest of history is limited secondary to patient's agitation.       Past Medical History:  Diagnosis Date  . ADHD (attention deficit hyperactivity disorder)   . Anxiety   . Depression   . Hepatitis   . Hepatitis C   . Hypertension     Patient Active Problem List   Diagnosis Date Noted  . Aggressive behavior, adult 09/08/2019    Past Surgical History:  Procedure Laterality Date  . ESOPHAGOGASTRODUODENOSCOPY (EGD) WITH PROPOFOL N/A 10/06/2015   Procedure: ESOPHAGOGASTRODUODENOSCOPY (EGD) WITH PROPOFOL;  Surgeon: Elnita Maxwell, MD;  Location: [facility] ENDOSCOPY;  Service: Endoscopy;  Laterality: N/A;    Prior to Admission medications   Medication Sig Start Date End Date Taking? Authorizing Provider  albuterol (PROVENTIL HFA;VENTOLIN HFA) 108 (90 Base) MCG/ACT inhaler Inhale 2 puffs into the lungs every 6 (six) hours as needed for wheezing or shortness of breath. 09/10/18   Bridget Hartshorn L, PA-C  cyclobenzaprine (FLEXERIL) 10 MG tablet Take 10 mg by mouth as directed.    [provider]  naproxen (NAPROSYN) 250 MG tablet Take by mouth 2 (two) times daily with a meal.    [provider]  predniSONE (DELTASONE) 10 MG tablet Take 3 tablets once a day until finished 09/10/18   Tommi Rumps, PA-C    Allergies Codeine  No  family history on file.  Social History Social History   Tobacco Use  . Smoking status: Current Every Day Smoker    Packs/day: 0.50    Types: Cigarettes  . Smokeless tobacco: Never Used  Substance Use Topics  . Alcohol use: Yes    Alcohol/week: 12.0 standard drinks    Types: 12 Cans of beer per week    Comment: 2 40's almost every day  . Drug use: Yes    Types: Marijuana, Cocaine    Comment: marijuana daily, cocaine sometimes    Review of Systems  Constitutional: No fever/chills Eyes: No visual changes. ENT: No sore throat. Cardiovascular: Denies chest pain. Respiratory: Positive for wheezing.  Denies shortness of breath. Gastrointestinal: No abdominal pain.  No nausea, no vomiting.  No diarrhea.  No constipation. Genitourinary: Negative for dysuria. Musculoskeletal: Negative for back pain. Skin: Negative for rash. Neurological: Negative for headaches, focal weakness or numbness. Psychiatric:  Positive for SI.   ____________________________________________   PHYSICAL EXAM:  VITAL SIGNS: ED Triage Vitals  Enc Vitals Group     BP 09/08/19 0111 (!) 155/91     Pulse Rate 09/08/19 0111 (!) 140     Resp 09/08/19 0111 (!) 22     Temp 09/08/19 0111 99.2 F (37.3 C)     Temp Source 09/08/19 0111 Oral     SpO2 09/08/19 0111 95 %     Weight 09/08/19 0112 265 lb (120.2 kg)     Height 09/08/19 0112  5\' 2"  (1.575 m)     Head Circumference --      Peak Flow --      Pain Score 09/08/19 0112 0     Pain Loc --      Pain Edu? --      Excl. in GC? --     Constitutional: Alert and oriented.  Disheveled appearing and in mild acute distress. Eyes: Conjunctivae are normal. PERRL. EOMI. Head: Atraumatic. Nose: Atraumatic. Mouth/Throat: Mucous membranes are moist.  No dental malocclusion. Neck: No stridor.   Cardiovascular: Tachycardic rate, regular rhythm. Grossly normal heart sounds.  Good peripheral circulation. Respiratory: Increased respiratory effort.  No retractions.  Lungs diminished with slight wheezing. Gastrointestinal: Soft and nontender. No distention. No abdominal bruits. No CVA tenderness. Musculoskeletal: No lower extremity tenderness nor edema.  No joint effusions. Neurologic:  Normal speech and language. No gross focal neurologic deficits are appreciated. MAEx4. Skin:  Skin is warm, dry and intact. No rash noted. Psychiatric: Mood and affect are tearful. Speech and behavior are normal.  ____________________________________________   LABS (all labs ordered are listed, but only abnormal results are displayed)  Labs Reviewed  COMPREHENSIVE METABOLIC PANEL - Abnormal; Notable for the following components:      Result Value   Potassium 3.4 (*)    CO2 21 (*)    Glucose, Bld 178 (*)    Creatinine, Ser 1.23 (*)    Calcium 8.8 (*)    GFR calc non Af Amer 54 (*)    All other components within normal limits  ETHANOL - Abnormal; Notable for the following components:   Alcohol, Ethyl (B) 148 (*)    All other components within normal limits  SALICYLATE LEVEL - Abnormal; Notable for the following components:   Salicylate Lvl <7.0 (*)    All other components within normal limits  ACETAMINOPHEN LEVEL - Abnormal; Notable for the following components:   Acetaminophen (Tylenol), Serum <10 (*)    All other components within normal limits  URINE DRUG SCREEN, QUALITATIVE ([facility] ONLY) - Abnormal; Notable for the following components:   Cocaine Metabolite,Ur [place] POSITIVE (*)    Cannabinoid 50 Ng, Ur [place] POSITIVE (*)    All other components within normal limits  URINALYSIS, COMPLETE (UACMP) WITH MICROSCOPIC - Abnormal; Notable for the following components:   Color, Urine YELLOW (*)    APPearance HAZY (*)    Protein, ur 30 (*)    Bacteria, UA RARE (*)    All other components within normal limits  CBC  POC URINE PREG, ED  POCT PREGNANCY, URINE   ____________________________________________  EKG  ED ECG REPORT I, Sophira Rumler J, the attending physician,  personally viewed and interpreted this ECG.   Date: 09/08/2019  EKG Time: 0115  Rate: 120  Rhythm: normal EKG, normal sinus rhythm, unchanged from previous tracings, sinus tachycardia  Axis: Normal  Intervals:none  ST&T Change: Nonspecific  ____________________________________________  RADIOLOGY  ED MD interpretation: None  Official radiology report(s): No results found.  ____________________________________________   PROCEDURES  Procedure(s) performed (including Critical Care):  .1-3 Lead EKG Interpretation Performed by: [id number], MD Authorized by: Irean Hong, MD     Interpretation: normal     ECG rate:  100   ECG rate assessment: normal     Rhythm: sinus rhythm     Ectopy: none     Conduction: normal   Comments:     Patient placed on cardiac monitor as she was initially tachycardic and wheezing  ____________________________________________   INITIAL IMPRESSION / ASSESSMENT AND PLAN / ED COURSE  As part of my medical decision making, I reviewed the following data within the [facility] notes reviewed and incorporated, Labs reviewed, EKG interpreted, Old chart reviewed, Radiograph reviewed, A consult was requested and obtained from this/these consultant(s) Psychiatry and Notes from prior ED visits     Andrea Cain was evaluated in Emergency Department on 09/08/2019 for the symptoms described in the history of present illness. She was evaluated in the context of the global COVID-19 pandemic, which necessitated consideration that the patient might be at risk for infection with the SARS-CoV-2 virus that causes COVID-19. Institutional protocols and algorithms that pertain to the evaluation of patients at risk for COVID-19 are in a state of rapid change based on information released by regulatory bodies including the CDC and federal and state organizations. These policies and algorithms were followed during the patient's care in the  ED.    44 year old female from group home brought under IVC for suicidal ideation.  Wheezing on examination.  Will administer prednisone, DuoNeb.  Continue IVC pending psychiatric evaluation and disposition.   Clinical Course as of Sep 08 611  Sat Sep 08, 2019  9935 Wheezing much improved.  Patient asleep.  Room air saturations 99%. The patient has been placed in psychiatric observation due to the need to provide a safe environment for the patient while obtaining psychiatric consultation and evaluation, as well as ongoing medical and medication management to treat the patient's condition. The patient has been placed under full IVC at this time.     [JS]    Clinical Course User Index [JS] Paulette Blanch, MD     ____________________________________________   FINAL CLINICAL IMPRESSION(S) / ED DIAGNOSES  Final diagnoses:  Anxiety and depression  Marijuana use  Cocaine use     ED Discharge Orders    None       Note:  This document was prepared using Dragon voice recognition software and may include unintentional dictation errors.   Paulette Blanch, MD 09/08/19 [phone]

## 2019-09-08 NOTE — ED Notes (Signed)
Dietary called, tray on the way up.

## 2019-09-08 NOTE — ED Notes (Signed)
Pt given meal tray.
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
## 2019-09-08 NOTE — ED Notes (Signed)
This RN review pt's discharge instructions and pt verbalized understanding. This RN unable to obtain electronic signature due to no computer access in room.
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
## 2019-09-08 NOTE — ED Notes (Signed)
Pt is wheezing with respirations audibly. PT is alert and oriented but states she has hx of asthma and cannot breath, voice is hoarse. Pt states she has used inhalor on way to ED but with no success.

## 2019-09-08 NOTE — ED Notes (Signed)
This RN attempted to call pt's transportation x2. Unable to reach anyone at this time. Will continue to try.
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
## 2019-09-08 NOTE — ED Notes (Signed)
The following items placed in one of one labeled belonging bag: pink t shirt, black t shirt, grey shorts, boxer shorts, white and black sandals, green key fob with two keys and metal stick.

## 2019-09-08 NOTE — ED Notes (Signed)
This RN in contact with pt's transportation. Pt friend stating it will be around 12p before she can get pt.
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
## 2019-09-08 NOTE — ED Notes (Signed)
Pt will not answer most questions asked by this nurse or psych NP. Pt stats she does not know why she is here. States that she has drank beer and smoked cigarettes today. States she only remembers being picked up and brought to ED and then responds she does not want to make any comments that will go against her.

## 2019-09-08 NOTE — ED Notes (Signed)
Pt calm currently, uncuffed by police.

## 2020-01-16 ENCOUNTER — Encounter: Payer: Self-pay | Admitting: Emergency Medicine

## 2020-01-16 ENCOUNTER — Other Ambulatory Visit: Payer: Self-pay

## 2020-01-16 ENCOUNTER — Emergency Department
Admission: EM | Admit: 2020-01-16 | Discharge: 2020-01-16 | Disposition: A | Discharge status: Home / Self Care | Payer: Medicaid Other | Attending: Emergency Medicine | Admitting: Emergency Medicine

## 2020-01-16 DIAGNOSIS — Z5321 Procedure and treatment not carried out due to patient leaving prior to being seen by health care provider: Secondary | ICD-10-CM | POA: Insufficient documentation

## 2020-01-16 DIAGNOSIS — J45901 Unspecified asthma with (acute) exacerbation: Secondary | ICD-10-CM | POA: Insufficient documentation

## 2020-01-16 HISTORY — DX: Unspecified asthma, uncomplicated: J45.909

## 2020-01-16 NOTE — ED Notes (Signed)
Pt reports feels better and is leaving.

## 2020-01-16 NOTE — ED Notes (Signed)
Pt IV removed, tip in place.

## 2020-01-16 NOTE — ED Triage Notes (Signed)
Pt in via EMS from MD office with c/o increased SOB over the last 3 days. Pt sat 88% on RA upon EMS arrival. Pt was given 125 mg of solumedrol, 1 duoneb and 1 albuterol. O2 sats increased to 100%.  Hx of HTN, was off meds for awhile and started back in december

## 2021-01-16 ENCOUNTER — Other Ambulatory Visit (HOSPITAL_COMMUNITY): Payer: Self-pay | Admitting: Physician Assistant

## 2021-01-16 ENCOUNTER — Other Ambulatory Visit: Payer: Self-pay | Admitting: Physician Assistant

## 2021-01-16 DIAGNOSIS — G4452 New daily persistent headache (NDPH): Secondary | ICD-10-CM

## 2021-01-27 ENCOUNTER — Other Ambulatory Visit: Payer: Self-pay

## 2021-01-27 ENCOUNTER — Ambulatory Visit
Admission: RE | Admit: 2021-01-27 | Discharge: 2021-01-27 | Disposition: A | Discharge status: Home / Self Care | Payer: Medicaid Other | Source: Ambulatory Visit | Attending: Physician Assistant | Admitting: Physician Assistant

## 2021-01-27 DIAGNOSIS — G4452 New daily persistent headache (NDPH): Secondary | ICD-10-CM | POA: Insufficient documentation

## 2021-01-27 MED ORDER — GADOBUTROL 1 MMOL/ML IV SOLN
10.0000 mL | Freq: Once | INTRAVENOUS | Status: AC | PRN
  Administered 2021-01-27: 10 mL via INTRAVENOUS

## 2022-03-12 DIAGNOSIS — Z6834 Body mass index (BMI) 34.0-34.9, adult: Secondary | ICD-10-CM | POA: Diagnosis not present

## 2022-03-12 DIAGNOSIS — E119 Type 2 diabetes mellitus without complications: Secondary | ICD-10-CM | POA: Diagnosis not present

## 2022-03-12 DIAGNOSIS — E669 Obesity, unspecified: Secondary | ICD-10-CM | POA: Diagnosis not present

## 2022-03-12 DIAGNOSIS — Z7985 Long-term (current) use of injectable non-insulin antidiabetic drugs: Secondary | ICD-10-CM | POA: Diagnosis not present

## 2022-03-12 DIAGNOSIS — Z833 Family history of diabetes mellitus: Secondary | ICD-10-CM | POA: Diagnosis not present

## 2022-03-12 DIAGNOSIS — Z811 Family history of alcohol abuse and dependence: Secondary | ICD-10-CM | POA: Diagnosis not present

## 2022-03-12 DIAGNOSIS — Z7984 Long term (current) use of oral hypoglycemic drugs: Secondary | ICD-10-CM | POA: Diagnosis not present

## 2022-03-12 DIAGNOSIS — Z720 Tobacco use: Secondary | ICD-10-CM | POA: Diagnosis not present

## 2022-03-12 DIAGNOSIS — F1021 Alcohol dependence, in remission: Secondary | ICD-10-CM | POA: Diagnosis not present

## 2022-03-12 DIAGNOSIS — I10 Essential (primary) hypertension: Secondary | ICD-10-CM | POA: Diagnosis not present

## 2022-08-18 ENCOUNTER — Other Ambulatory Visit: Payer: Self-pay | Admitting: Internal Medicine

## 2022-08-18 DIAGNOSIS — M545 Low back pain, unspecified: Secondary | ICD-10-CM

## 2022-11-24 ENCOUNTER — Ambulatory Visit
Admission: RE | Admit: 2022-11-24 | Discharge: 2022-11-24 | Disposition: A | Discharge status: Home / Self Care | Payer: Medicaid Other | Source: Ambulatory Visit | Attending: Internal Medicine | Admitting: Internal Medicine

## 2022-11-24 DIAGNOSIS — G8929 Other chronic pain: Secondary | ICD-10-CM

## 2022-11-25 DIAGNOSIS — Z720 Tobacco use: Secondary | ICD-10-CM | POA: Diagnosis not present

## 2022-11-25 DIAGNOSIS — I10 Essential (primary) hypertension: Secondary | ICD-10-CM | POA: Diagnosis not present

## 2022-11-25 DIAGNOSIS — Z8249 Family history of ischemic heart disease and other diseases of the circulatory system: Secondary | ICD-10-CM | POA: Diagnosis not present

## 2022-11-25 DIAGNOSIS — F419 Anxiety disorder, unspecified: Secondary | ICD-10-CM | POA: Diagnosis not present

## 2022-11-25 DIAGNOSIS — E1142 Type 2 diabetes mellitus with diabetic polyneuropathy: Secondary | ICD-10-CM | POA: Diagnosis not present

## 2022-11-25 DIAGNOSIS — E669 Obesity, unspecified: Secondary | ICD-10-CM | POA: Diagnosis not present

## 2022-11-25 DIAGNOSIS — Z809 Family history of malignant neoplasm, unspecified: Secondary | ICD-10-CM | POA: Diagnosis not present

## 2022-11-25 DIAGNOSIS — Z823 Family history of stroke: Secondary | ICD-10-CM | POA: Diagnosis not present

## 2022-11-25 DIAGNOSIS — Z791 Long term (current) use of non-steroidal anti-inflammatories (NSAID): Secondary | ICD-10-CM | POA: Diagnosis not present

## 2022-11-25 DIAGNOSIS — M1990 Unspecified osteoarthritis, unspecified site: Secondary | ICD-10-CM | POA: Diagnosis not present

## 2022-11-25 DIAGNOSIS — Z818 Family history of other mental and behavioral disorders: Secondary | ICD-10-CM | POA: Diagnosis not present

## 2022-11-25 DIAGNOSIS — Z833 Family history of diabetes mellitus: Secondary | ICD-10-CM | POA: Diagnosis not present

## 2022-12-13 ENCOUNTER — Other Ambulatory Visit: Payer: Self-pay | Admitting: Family Medicine

## 2022-12-13 DIAGNOSIS — R9389 Abnormal findings on diagnostic imaging of other specified body structures: Secondary | ICD-10-CM

## 2022-12-13 DIAGNOSIS — N946 Dysmenorrhea, unspecified: Secondary | ICD-10-CM

## 2022-12-24 ENCOUNTER — Ambulatory Visit
Admission: RE | Admit: 2022-12-24 | Discharge: 2022-12-24 | Disposition: A | Discharge status: Home / Self Care | Payer: MEDICAID | Source: Ambulatory Visit | Attending: Family Medicine | Admitting: Family Medicine

## 2022-12-24 DIAGNOSIS — N946 Dysmenorrhea, unspecified: Secondary | ICD-10-CM

## 2023-01-04 ENCOUNTER — Other Ambulatory Visit: Payer: Self-pay | Admitting: Family Medicine

## 2023-01-04 DIAGNOSIS — N946 Dysmenorrhea, unspecified: Secondary | ICD-10-CM

## 2023-01-11 ENCOUNTER — Ambulatory Visit: Payer: MEDICAID

## 2023-01-19 ENCOUNTER — Emergency Department
Admission: EM | Admit: 2023-01-19 | Discharge: 2023-01-19 | Discharge status: Against Medical Advice | Payer: MEDICAID | Attending: Emergency Medicine | Admitting: Emergency Medicine

## 2023-01-19 ENCOUNTER — Ambulatory Visit
Admission: EM | Admit: 2023-01-19 | Discharge: 2023-01-19 | Disposition: A | Discharge status: Home / Self Care | Payer: No Typology Code available for payment source | Source: Ambulatory Visit | Attending: Emergency Medicine | Admitting: Emergency Medicine

## 2023-01-19 ENCOUNTER — Other Ambulatory Visit: Payer: Self-pay

## 2023-01-19 ENCOUNTER — Emergency Department: Payer: MEDICAID

## 2023-01-19 DIAGNOSIS — S80211A Abrasion, right knee, initial encounter: Secondary | ICD-10-CM | POA: Insufficient documentation

## 2023-01-19 DIAGNOSIS — T7421XA Adult sexual abuse, confirmed, initial encounter: Secondary | ICD-10-CM | POA: Diagnosis present

## 2023-01-19 DIAGNOSIS — S80212A Abrasion, left knee, initial encounter: Secondary | ICD-10-CM | POA: Diagnosis not present

## 2023-01-19 DIAGNOSIS — T07XXXA Unspecified multiple injuries, initial encounter: Secondary | ICD-10-CM

## 2023-01-19 DIAGNOSIS — S0003XA Contusion of scalp, initial encounter: Secondary | ICD-10-CM | POA: Diagnosis not present

## 2023-01-19 DIAGNOSIS — Z0441 Encounter for examination and observation following alleged adult rape: Secondary | ICD-10-CM | POA: Insufficient documentation

## 2023-01-19 DIAGNOSIS — I10 Essential (primary) hypertension: Secondary | ICD-10-CM | POA: Diagnosis not present

## 2023-01-19 DIAGNOSIS — S90519A Abrasion, unspecified ankle, initial encounter: Secondary | ICD-10-CM | POA: Diagnosis not present

## 2023-01-19 DIAGNOSIS — E876 Hypokalemia: Secondary | ICD-10-CM

## 2023-01-19 LAB — HEPATITIS C ANTIBODY: HCV Ab: REACTIVE — AB

## 2023-01-19 LAB — COMPREHENSIVE METABOLIC PANEL
ALT: 28 U/L (ref 0–44)
AST: 27 U/L (ref 15–41)
Albumin: 4 g/dL (ref 3.5–5.0)
Alkaline Phosphatase: 46 U/L (ref 38–126)
Anion gap: 10 (ref 5–15)
BUN: 16 mg/dL (ref 6–20)
CO2: 28 mmol/L (ref 22–32)
Calcium: 8.8 mg/dL — ABNORMAL LOW (ref 8.9–10.3)
Chloride: 99 mmol/L (ref 98–111)
Creatinine, Ser: 0.78 mg/dL (ref 0.44–1.00)
GFR, Estimated: >60 mL/min (ref >60)
Glucose, Bld: 125 mg/dL — ABNORMAL HIGH (ref 70–99)
Potassium: 2.5 mmol/L — CL (ref 3.5–5.1)
Sodium: 137 mmol/L (ref 135–145)
Total Bilirubin: 1 mg/dL (ref 0.3–1.2)
Total Protein: 7 g/dL (ref 6.5–8.1)

## 2023-01-19 LAB — RAPID HIV SCREEN (HIV 1/2 AB+AG)
HIV 1/2 Antibodies: NONREACTIVE
HIV-1 P24 Antigen - HIV24: NONREACTIVE

## 2023-01-19 LAB — HEPATITIS B SURFACE ANTIGEN: Hepatitis B Surface Ag: NONREACTIVE

## 2023-01-19 MED ORDER — AZITHROMYCIN 500 MG PO TABS
1000.0000 mg | ORAL_TABLET | Freq: Once | ORAL | Status: AC
  Administered 2023-01-19: 1000 mg via ORAL
  Filled 2023-01-19: qty 2

## 2023-01-19 MED ORDER — ULIPRISTAL ACETATE 30 MG PO TABS
30.0000 mg | ORAL_TABLET | Freq: Once | ORAL | Status: AC
  Administered 2023-01-19: 30 mg via ORAL
  Filled 2023-01-19: qty 1

## 2023-01-19 MED ORDER — ONDANSETRON 4 MG PO TBDP
4.0000 mg | ORAL_TABLET | Freq: Once | ORAL | Status: AC
  Administered 2023-01-19: 4 mg via ORAL
  Filled 2023-01-19: qty 1

## 2023-01-19 MED ORDER — LIDOCAINE HCL (PF) 1 % IJ SOLN
1.0000 mL | Freq: Once | INTRAMUSCULAR | Status: AC
  Administered 2023-01-19: 1 mL
  Filled 2023-01-19: qty 5

## 2023-01-19 MED ORDER — CEFTRIAXONE SODIUM 1 G IJ SOLR
500.0000 mg | Freq: Once | INTRAMUSCULAR | Status: AC
  Administered 2023-01-19: 500 mg via INTRAMUSCULAR
  Filled 2023-01-19: qty 10

## 2023-01-19 MED ORDER — METRONIDAZOLE 500 MG PO TABS
2000.0000 mg | ORAL_TABLET | Freq: Once | ORAL | Status: AC
  Administered 2023-01-19: 2000 mg via ORAL
  Filled 2023-01-19: qty 4

## 2023-01-19 MED ORDER — POTASSIUM CHLORIDE CRYS ER 20 MEQ PO TBCR: 40.0000 meq | EXTENDED_RELEASE_TABLET | Freq: Once | ORAL | Status: DC

## 2023-01-19 MED ORDER — POTASSIUM CHLORIDE ER 10 MEQ PO TBCR: 10.0000 meq | EXTENDED_RELEASE_TABLET | Freq: Every day | ORAL | 0 refills | Status: AC

## 2023-01-19 MED ORDER — IBUPROFEN 600 MG PO TABS
600.0000 mg | ORAL_TABLET | Freq: Once | ORAL | Status: AC
  Administered 2023-01-19: 600 mg via ORAL
  Filled 2023-01-19: qty 1

## 2023-01-19 NOTE — ED Notes (Signed)
Preg test negative
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
## 2023-01-19 NOTE — ED Notes (Signed)
Dr. Scotty Court on phone with [facility]. RN

## 2023-01-19 NOTE — Discharge Instructions (Addendum)
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 flushed down the toilet. If you are not sure, ask your care team. If it is safe to put it in the trash, take the medication out of the container. Mix the medication with cat litter, dirt, coffee grounds, or other unwanted substance. Seal the mixture in a bag or container. Put it in the trash. NOTE: This sheet is a summary. It may not cover all possible information. If you have questions about this medicine,  talk to your doctor, pharmacist, or health care provider.  2022 Elsevier/Gold Standard (2020-07-31 13:29:17)  Ulipristal Tablets  What is this medication? ULIPRISTAL (UE li pris tal) can prevent pregnancy. It should be taken as soon as possible in the 5 days (120 hours) after unprotected sex or if you think your contraceptive didn't work. It belongs to a group of medications called emergency contraceptives. It does not prevent HIV or other sexually transmitted infections (STIs). This medicine may be used for  other purposes; ask your health care provider or pharmacist if you have questions. COMMON BRAND NAME(S): ella What should I tell my care team before I take this medication? They need to know if you have any of these conditions: Liver disease An unusual or allergic reaction to ulipristal, other medications, foods, dyes, or preservatives Pregnant or trying to get pregnant Breast-feeding How should I use this medication? Take this medication by mouth with or without food. Your care team may want you to use a quick-response pregnancy test prior to using the tablets. Take your medication as soon as possible and not more than 5 days (120 hours) after the event. This medication can be taken at any time during your menstrual cycle. Follow the dose instructions of your care team exactly. Contact your care team right away if you vomit within 3 hours of taking your medication to discuss if you need to take another tablet. A patient package insert for the product will be given with each prescription and refill. Read this sheet carefully each time. The sheet may change frequently. Contact your care team about the use of this medication in children. Special care may be needed. Overdosage: If you think you have taken too much of this medicine contact a poison control center or emergency room at once. NOTE: This medicine is only for you. Do not share this medicine with others. What if I miss a dose? This medication is not for regular use. If you vomit within 3 hours of taking your dose, contact your care team for instructions. What may interact with this medication? This medication may interact with the following: Barbiturates such as phenobarbital or primidone Birth control pills Bosentan Carbamazepine Certain medications for fungal infections like griseofulvin, itraconazole, and ketoconazole Certain medications for HIV or AIDS or  hepatitis Dabigatran Digoxin Felbamate Fexofenadine Oxcarbazepine Phenytoin Rifampin St. John's Wort Topiramate This list may not describe all possible interactions. Give your health care provider a list of all the medicines, herbs, non-prescription drugs, or dietary supplements you use. Also tell them if you smoke, drink alcohol, or use illegal drugs. Some items may interact with your medicine. What should I watch for while using this medication? Your period may begin a few days earlier or later than expected. If your period is more than 7 days late, pregnancy is possible. See your care team as soon as you can and get a pregnancy test. Talk to your care team before taking this medication if you know or suspect that you are pregnant. Contact your care team if you think you may be pregnant and you have taken this medication. If you have severe abdominal pain about 3 to 5 weeks after taking this medication, you may have a pregnancy outside the womb, which is called an ectopic or tubal pregnancy. Call your care team or go to the nearest emergency room right away if you think this is happening. Discuss birth control options with your care team. Emergency birth control is not to be used routinely to  prevent pregnancy. It should not be used more than once in the same cycle. Birth control pills may not work properly while you are taking this medication. Wait at least 5 days after taking this medication to start or continue other hormone based birth control. Be sure to use a reliable barrier contraceptive method (such as a condom with spermicide) between the time you take this medication and your next period. This medication does not protect you against HIV infection (AIDS) or any other sexually transmitted diseases (STDs). What side effects may I notice from receiving this medication? Side effects that you should report to your care team as soon as possible: Allergic reactions-skin rash, itching, hives,  swelling of the face, lips, tongue, or throat Side effects that usually do not require medical attention (report to your care team if they continue or are bothersome): Dizziness Fatigue Headache Irregular menstrual cycles or spotting Menstrual cramps Nausea Stomach pain This list may not describe all possible side effects. Call your doctor for medical advice about side effects. You may report side effects to FDA at 1-800-FDA-1088.

## 2023-01-19 NOTE — ED Notes (Signed)
Sane   Nurse  called  per  Beverly Milch RN
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
## 2023-01-19 NOTE — Consult Note (Signed)
T/C to listed number for patient's cell phone. Female answered stating that he was a back up messenger to get information to Ms. Roxan Hockey about doctor appointments. I advised that I could not release any information about patient, but I did ask him to have her call her pharmacy.

## 2023-01-19 NOTE — ED Notes (Signed)
Secretary notified to call SANE RN

## 2023-01-19 NOTE — ED Notes (Signed)
Patient has visitor who asked this nurse and another about when the patient is to be discharged. Stated there was no order up at this time for d/c and the MD will be notified of the patients ride being present and if there will be any idea of Dcd. Friend frustrated stating she was here as a favor and can't wait. Asked if we can arrange her a cab. She was instructed to wait in the room and not walking halls until further info was gathered. Patient and friend were then observed leaving the room and exited to lobby.
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
## 2023-01-19 NOTE — Consult Note (Addendum)
T/C to [facility] to reach out to advocate who left with patient. Number for Bud Face was provided. [phone]. Message left to return call.

## 2023-01-20 NOTE — Consult Note (Signed)
01/20/2023 0820: T/C from Bud Face from [place]. I asked Ms. Dutch Quint if she had an accurate phone number to contact patient. She reported that she did not as patient used an app which needed wifi to make calls. I informed that there was a prescription of medication at patient's pharmacy that she needed to pick up. Ms. Dutch Quint stated that she would try to reach out to patient today. I also provided the office number at [facility] to give to patient in case she had any questions.

## 2023-02-15 ENCOUNTER — Ambulatory Visit: Admission: RE | Admit: 2023-02-15 | Payer: MEDICAID | Source: Ambulatory Visit

## 2023-03-10 ENCOUNTER — Other Ambulatory Visit: Payer: Self-pay

## 2023-03-10 ENCOUNTER — Emergency Department
Admission: EM | Admit: 2023-03-10 | Discharge: 2023-03-10 | Disposition: A | Discharge status: Home / Self Care | Payer: MEDICAID | Attending: Emergency Medicine | Admitting: Emergency Medicine

## 2023-03-10 ENCOUNTER — Encounter: Payer: Self-pay | Admitting: Emergency Medicine

## 2023-03-10 ENCOUNTER — Emergency Department: Payer: MEDICAID

## 2023-03-10 DIAGNOSIS — M533 Sacrococcygeal disorders, not elsewhere classified: Secondary | ICD-10-CM | POA: Diagnosis present

## 2023-03-10 DIAGNOSIS — W010XXA Fall on same level from slipping, tripping and stumbling without subsequent striking against object, initial encounter: Secondary | ICD-10-CM | POA: Insufficient documentation

## 2023-03-10 MED ORDER — ACETAMINOPHEN 325 MG PO TABS
650.0000 mg | ORAL_TABLET | Freq: Once | ORAL | Status: AC
  Administered 2023-03-10: 650 mg via ORAL
  Filled 2023-03-10: qty 2

## 2023-03-10 MED ORDER — LIDOCAINE 5 % EX PTCH
1.0000 | MEDICATED_PATCH | CUTANEOUS | Status: DC
  Administered 2023-03-10: 1 via TRANSDERMAL
  Filled 2023-03-10: qty 1

## 2023-03-10 NOTE — ED Provider Notes (Signed)
[facility] Provider Note    Event Date/Time   First MD Initiated Contact with Patient 03/10/23 1019     (approximate)   History   Fall   HPI  Andrea Cain is a 47 y.o. female who presents today for evaluation of coccyx pain after a slip and fall last night.  Patient reports that she was cleaning yesterday and slipped and fell directly onto her tailbone.  She reports that she has injured her tailbone last year as well, but the last year was worse.  She denies numbness or tingling in her extremities.  She reports that she has pain with sitting.  She has not noticed any skin changes or swelling.  No urinary fecal incontinence or retention.  No history of or LOC.  No other injury sustained.  Patient Active Problem List   Diagnosis Date Noted   Aggressive behavior, adult 09/08/2019   Anxiety and depression           Physical Exam   Triage Vital Signs: ED Triage Vitals [03/10/23 1006]  Encounter Vitals Group     BP (!) 135/108     Systolic BP Percentile      Diastolic BP Percentile      Pulse Rate 99     Resp 16     Temp 98.1 F (36.7 C)     Temp Source Oral     SpO2 96 %     Weight      Height      Head Circumference      Peak Flow      Pain Score 10     Pain Loc      Pain Education      Exclude from Growth Chart     Most recent vital signs: Vitals:   03/10/23 1006 03/10/23 1243  BP: (!) 135/108 (!) 130/90  Pulse: 99 89  Resp: 16 16  Temp: 98.1 F (36.7 C)   SpO2: 96% 97%    Physical Exam Vitals and nursing note reviewed.  Constitutional:      General: Awake and alert. No acute distress.    Appearance: Normal appearance. The patient is normal weight.  HENT:     Head: Normocephalic and atraumatic.     Mouth: Mucous membranes are moist.  Eyes:     General: PERRL. Normal EOMs        Right eye: No discharge.        Left eye: No discharge.     Conjunctiva/sclera: Conjunctivae normal.  Cardiovascular:     Rate and Rhythm:  Normal rate and regular rhythm.     Pulses: Normal pulses.  Pulmonary:     Effort: Pulmonary effort is normal. No respiratory distress.     Breath sounds: Normal breath sounds.  Abdominal:     Abdomen is soft. There is no abdominal tenderness. No rebound or guarding. No distention. Musculoskeletal:        General: No swelling. Normal range of motion.     Cervical back: Normal range of motion and neck supple.  Pelvis stable. No lumbar spine tenderness. TTP to coccyx without erythema or swelling Back: No midline tenderness. Strength and sensation 5/5 to bilateral lower extremities. Normal great toe extension against resistance. Normal sensation throughout feet. Normal patellar reflexes. Negative SLR and opposite SLR bilaterally. Negative FABER test Skin:    General: Skin is warm and dry.     Capillary Refill: Capillary refill takes less than 2 seconds.  Findings: No rash.  Neurological:     Mental Status: The patient is awake and alert.      ED Results / Procedures / Treatments   Labs (all labs ordered are listed, but only abnormal results are displayed) Labs Reviewed - No data to display   EKG     RADIOLOGY     PROCEDURES:  Critical Care performed:   Procedures   MEDICATIONS ORDERED IN ED: Medications  lidocaine (LIDODERM) 5 % 1 patch (1 patch Transdermal Patch Applied 03/10/23 1031)  acetaminophen (TYLENOL) tablet 650 mg (650 mg Oral Given 03/10/23 1032)     IMPRESSION / MDM / ASSESSMENT AND PLAN / ED COURSE  I reviewed the triage vital signs and the nursing notes.   Differential diagnosis includes, but is not limited to, coxalgia, contusion, fracture.  Patient is awake and alert, hemodynamically stable and afebrile.  She is sitting comfortably on the stretcher.  She has normal strength and sensation bilateral lower extremities.  No abdominal pain or tenderness on exam.  Mild tenderness over her coccyx, no overlying skin changes.  No ecchymosis or swelling.   No open wounds.  No erythema.  She is able to ambulate with a steady gait.  X-ray obtained.  She was treated symptomatically while awaiting results of x-ray with good effect. XR reveals old findings, though no acute injuries noted.   We discussed symptomatic management such as sitting on a doughnut pillow to help relieve pressure off of her coccyx.  We discussed return precautions and outpatient follow-up.  Patient or stands and agrees with plan.  She was discharged in stable condition.   Patient's presentation is most consistent with acute complicated illness / injury requiring diagnostic workup.   FINAL CLINICAL IMPRESSION(S) / ED DIAGNOSES   Final diagnoses:  Coccyalgia     Rx / DC Orders   ED Discharge Orders     None        Note:  This document was prepared using Dragon voice recognition software and may include unintentional dictation errors.   Jackelyn Hoehn, PA-C 03/10/23 1332    Sharyn Creamer, MD 03/10/23 1526

## 2023-03-10 NOTE — Discharge Instructions (Addendum)
You may sit on the donut pillow for extra support. You may take tylenol/motrin per package instructions to help with pain.  Return for any new, worsening, or changing symptoms or other concerns.  It was a pleasure caring for you today.

## 2023-03-10 NOTE — ED Triage Notes (Signed)
Slipped and fell last night.  C/O coccyx pain.

## 2023-04-13 ENCOUNTER — Telehealth: Payer: Self-pay

## 2023-04-13 NOTE — Telephone Encounter (Signed)
Transition Care Management Unsuccessful Follow-up Telephone Call  Date of discharge and from where:  [place] 9/19  Attempts:  1st Attempt  Reason for unsuccessful TCM follow-up call:  No answer/busy   Lenard Forth [place]  [facility], [facility] Guide, Phone: [phone] Website: [email or web address]

## 2023-04-14 ENCOUNTER — Telehealth: Payer: Self-pay

## 2023-04-14 NOTE — Telephone Encounter (Signed)
Transition Care Management Unsuccessful Follow-up Telephone Call  Date of discharge and from where:  [place] 9/19  Attempts:  2nd Attempt  Reason for unsuccessful TCM follow-up call:  No answer/busy   Lenard Forth [place]  [facility], [facility] Guide, Phone: [phone] Website: [email or web address]

## 2023-05-09 ENCOUNTER — Ambulatory Visit
Admission: RE | Admit: 2023-05-09 | Discharge: 2023-05-09 | Disposition: A | Discharge status: Home / Self Care | Payer: MEDICAID | Source: Ambulatory Visit | Attending: Family Medicine | Admitting: Family Medicine

## 2023-05-09 DIAGNOSIS — R9389 Abnormal findings on diagnostic imaging of other specified body structures: Secondary | ICD-10-CM | POA: Diagnosis present

## 2023-05-09 DIAGNOSIS — N946 Dysmenorrhea, unspecified: Secondary | ICD-10-CM | POA: Insufficient documentation

## 2023-07-21 ENCOUNTER — Other Ambulatory Visit: Payer: Self-pay

## 2023-07-21 ENCOUNTER — Encounter: Payer: Self-pay | Admitting: Intensive Care

## 2023-07-21 ENCOUNTER — Ambulatory Visit
Admission: RE | Admit: 2023-07-21 | Discharge: 2023-07-21 | Disposition: A | Discharge status: Home / Self Care | Payer: MEDICAID | Source: Ambulatory Visit | Attending: Primary Care | Admitting: Primary Care

## 2023-07-21 ENCOUNTER — Ambulatory Visit
Admission: RE | Admit: 2023-07-21 | Discharge: 2023-07-21 | Disposition: A | Discharge status: Home / Self Care | Payer: MEDICAID | Source: Home / Self Care | Attending: Primary Care | Admitting: Primary Care

## 2023-07-21 ENCOUNTER — Emergency Department
Admission: EM | Admit: 2023-07-21 | Discharge: 2023-07-21 | Disposition: A | Discharge status: Home / Self Care | Payer: MEDICAID | Attending: Emergency Medicine | Admitting: Emergency Medicine

## 2023-07-21 ENCOUNTER — Other Ambulatory Visit: Payer: Self-pay | Admitting: Primary Care

## 2023-07-21 ENCOUNTER — Emergency Department: Payer: MEDICAID

## 2023-07-21 DIAGNOSIS — M5126 Other intervertebral disc displacement, lumbar region: Secondary | ICD-10-CM | POA: Insufficient documentation

## 2023-07-21 DIAGNOSIS — R1032 Left lower quadrant pain: Secondary | ICD-10-CM | POA: Insufficient documentation

## 2023-07-21 DIAGNOSIS — M25552 Pain in left hip: Secondary | ICD-10-CM | POA: Diagnosis present

## 2023-07-21 DIAGNOSIS — M51369 Other intervertebral disc degeneration, lumbar region without mention of lumbar back pain or lower extremity pain: Secondary | ICD-10-CM

## 2023-07-21 LAB — URINALYSIS, ROUTINE W REFLEX MICROSCOPIC
Bacteria, UA: NONE SEEN
Bilirubin Urine: NEGATIVE
Glucose, UA: NEGATIVE mg/dL
Hgb urine dipstick: NEGATIVE
Ketones, ur: NEGATIVE mg/dL
Nitrite: NEGATIVE
Protein, ur: NEGATIVE mg/dL
Specific Gravity, Urine: 1.024 (ref 1.005–1.030)
pH: 5 (ref 5.0–8.0)

## 2023-07-21 LAB — COMPREHENSIVE METABOLIC PANEL
ALT: 23 U/L (ref 0–44)
AST: 25 U/L (ref 15–41)
Albumin: 3.9 g/dL (ref 3.5–5.0)
Alkaline Phosphatase: 45 U/L (ref 38–126)
Anion gap: 13 (ref 5–15)
BUN: 15 mg/dL (ref 6–20)
CO2: 23 mmol/L (ref 22–32)
Calcium: 9.2 mg/dL (ref 8.9–10.3)
Chloride: 102 mmol/L (ref 98–111)
Creatinine, Ser: 0.95 mg/dL (ref 0.44–1.00)
GFR, Estimated: >60 mL/min (ref >60)
Glucose, Bld: 97 mg/dL (ref 70–99)
Potassium: 3.1 mmol/L — ABNORMAL LOW (ref 3.5–5.1)
Sodium: 138 mmol/L (ref 135–145)
Total Bilirubin: 1 mg/dL (ref 0.0–1.2)
Total Protein: 7.5 g/dL (ref 6.5–8.1)

## 2023-07-21 LAB — CBC WITH DIFFERENTIAL/PLATELET
Abs Immature Granulocytes: 0.01 10*3/uL (ref 0.00–0.07)
Basophils Absolute: 0 10*3/uL (ref 0.0–0.1)
Basophils Relative: 1 %
Eosinophils Absolute: 0.1 10*3/uL (ref 0.0–0.5)
Eosinophils Relative: 2 %
HCT: 35.8 % — ABNORMAL LOW (ref 36.0–46.0)
Hemoglobin: 12.9 g/dL (ref 12.0–15.0)
Immature Granulocytes: 0 %
Lymphocytes Relative: 50 %
Lymphs Abs: 3 10*3/uL (ref 0.7–4.0)
MCH: 33.8 pg (ref 26.0–34.0)
MCHC: 36 g/dL (ref 30.0–36.0)
MCV: 93.7 fL (ref 80.0–100.0)
Monocytes Absolute: 0.7 10*3/uL (ref 0.1–1.0)
Monocytes Relative: 12 %
Neutro Abs: 2.1 10*3/uL (ref 1.7–7.7)
Neutrophils Relative %: 35 %
Platelets: 348 10*3/uL (ref 150–400)
RBC: 3.82 MIL/uL — ABNORMAL LOW (ref 3.87–5.11)
RDW: 11.9 % (ref 11.5–15.5)
WBC: 5.9 10*3/uL (ref 4.0–10.5)
nRBC: 0 % (ref 0.0–0.2)

## 2023-07-21 LAB — HCG, QUANTITATIVE, PREGNANCY: hCG, Beta Chain, Quant, S: <1 m[IU]/mL (ref <5)

## 2023-07-21 MED ORDER — PREDNISONE 10 MG (21) PO TBPK
ORAL_TABLET | ORAL | 0 refills | Status: DC
Start: 2023-07-21 — End: 2023-08-22

## 2023-07-21 MED ORDER — SODIUM CHLORIDE 0.9 % IV BOLUS
1000.0000 mL | Freq: Once | INTRAVENOUS | Status: AC
  Administered 2023-07-21: 1000 mL via INTRAVENOUS

## 2023-07-21 MED ORDER — MORPHINE SULFATE (PF) 4 MG/ML IV SOLN
4.0000 mg | Freq: Once | INTRAVENOUS | Status: AC
  Administered 2023-07-21: 4 mg via INTRAVENOUS
  Filled 2023-07-21: qty 1

## 2023-07-21 MED ORDER — IOHEXOL 300 MG/ML  SOLN
100.0000 mL | Freq: Once | INTRAMUSCULAR | Status: AC | PRN
  Administered 2023-07-21: 100 mL via INTRAVENOUS

## 2023-07-21 NOTE — ED Provider Notes (Signed)
[facility] Provider Note   Event Date/Time   First MD Initiated Contact with Patient 07/21/23 1248     (approximate) History  Pelvic Pain and Back Pain  HPI Andrea Cain is a 48 y.o. female patient with a stated past medical history of obesity and chronic back pain who presents for left lower quadrant abdominal pain as well as lumbar back pain that has been present since she has been in jail.  Patient also endorses vaginal bleeding that has been present over the last month.  Patient describes a 10/10, cramping, radiating pain from the left lower lumbar region around to the left lower quadrant of her abdomen that is worse with movement ROS: Patient currently denies any vision changes, tinnitus, difficulty speaking, facial droop, sore throat, chest pain, shortness of breath, abdominal pain, nausea/vomiting/diarrhea, dysuria, or weakness/numbness/paresthesias in any extremity   Physical Exam  Triage Vital Signs: ED Triage Vitals  Encounter Vitals Group     BP 07/21/23 1153 102/84     Systolic BP Percentile --      Diastolic BP Percentile --      Pulse Rate 07/21/23 1153 (!) 108     Resp 07/21/23 1153 18     Temp 07/21/23 1153 98 F (36.7 C)     Temp Source 07/21/23 1153 Oral     SpO2 07/21/23 1153 100 %     Weight 07/21/23 1154 205 lb (93 kg)     Height 07/21/23 1154 5\' 5"  (1.651 m)     Head Circumference --      Peak Flow --      Pain Score 07/21/23 1154 10     Pain Loc --      Pain Education --      Exclude from Growth Chart --    Most recent vital signs: Vitals:   07/21/23 1153  BP: 102/84  Pulse: (!) 108  Resp: 18  Temp: 98 F (36.7 C)  SpO2: 100%   General: Awake, oriented x4. CV:  Good peripheral perfusion.  Resp:  Normal effort.  Abd:  No distention.  Other:  Obese middle-aged African-American female resting comfortably in no mild distress secondary to pain ED Results / Procedures / Treatments  Labs (all labs ordered are listed,  but only abnormal results are displayed) Labs Reviewed  CBC WITH DIFFERENTIAL/PLATELET - Abnormal; Notable for the following components:      Result Value   RBC 3.82 (*)    HCT 35.8 (*)    All other components within normal limits  COMPREHENSIVE METABOLIC PANEL - Abnormal; Notable for the following components:   Potassium 3.1 (*)    All other components within normal limits  URINALYSIS, ROUTINE W REFLEX MICROSCOPIC - Abnormal; Notable for the following components:   Color, Urine YELLOW (*)    APPearance HAZY (*)    Leukocytes,Ua TRACE (*)    All other components within normal limits  HCG, QUANTITATIVE, PREGNANCY   RADIOLOGY ED MD interpretation: CT of the abdomen pelvis with IV contrast independently interpreted and shows no bowel obstruction, free air, or free fluid.  There is scattered stool without the colon.  There is also degenerative changes of the spine and pelvis as well as areas of sclerosis along both sacroiliac joints -Agree with radiology assessment Official radiology report(s): DG HIP UNILAT WITH PELVIS 2-3 VIEWS LEFT Result Date: 07/21/2023 CLINICAL DATA:  Left hip pain EXAM: DG HIP (WITH OR WITHOUT PELVIS) 2-3V LEFT COMPARISON:  None Available. FINDINGS:  No fracture or dislocation. Mild hyperostosis. There are areas of sclerosis along the sacroiliac joints, right-greater-than-left as well as some areas along the inferior aspect of the right pubic bone. Please correlate with any prior workup or additional evaluation when clinically appropriate. IMPRESSION: Hyperostosis. Areas of sclerosis seen such as along the sacroiliac joint margins and the right inferior pubic ramus, ischial region. Electronically Signed   By: Karen Kays M.D.   On: 07/21/2023 14:44   CT ABDOMEN PELVIS W CONTRAST Result Date: 07/21/2023 CLINICAL DATA:  Uterine cyst.  Pelvic and left hip pain EXAM: CT ABDOMEN AND PELVIS WITH CONTRAST TECHNIQUE: Multidetector CT imaging of the abdomen and pelvis was  performed using the standard protocol following bolus administration of intravenous contrast. RADIATION DOSE REDUCTION: This exam was performed according to the departmental dose-optimization program which includes automated exposure control, adjustment of the mA and/or kV according to patient size and/or use of iterative reconstruction technique. CONTRAST:  OMNIPAQUE IOHEXOL 300 MG/ML  SOLN COMPARISON:  Pelvic ultrasound 12/24/2022, 05/09/2023. Limited abdominal ultrasound 07/31/2015. FINDINGS: Lower chest: Slight linear opacity along the lung bases likely scar or atelectasis. No pleural effusion. Hepatobiliary: No focal liver abnormality is seen. No gallstones, gallbladder wall thickening, or biliary dilatation. Gallbladder is folded. Patent portal vein. Pancreas: Unremarkable. No pancreatic ductal dilatation or surrounding inflammatory changes. Spleen: Normal in size without focal abnormality. Adrenals/Urinary Tract: Adrenal glands are preserved. No enhancing renal mass or collecting system dilatation. The ureters have normal course and caliber extending down to the urinary bladder. The bladder has a preserved contour. Stomach/Bowel: Large bowel has a normal course and caliber with scattered stool. The cecum resides in the low anterior pelvis. There is a normal appendix extending superior to the left from the cecum. Stomach has some luminal fluid. Small bowel is nondilated. Vascular/Lymphatic: Aortic atherosclerosis. No enlarged abdominal or pelvic lymph nodes. Reproductive: Uterus and bilateral adnexa are unremarkable. Previous cystic lesion in the right ovary by ultrasound is not seen today. Only small cystic foci are seen measuring up to 16 mm along the ovaries, possible dominant follicles. Other: No free air or free fluid. Musculoskeletal: Scattered degenerative changes of the spine and pelvis. There is multilevel disc bulging in the spine greatest at L5-S1. Also sclerosis along the sacroiliac joints,  right-greater-than-left. Sclerosis along the right ischial region as well, nonspecific. Degenerative changes of the hips. IMPRESSION: No bowel obstruction, free air or free fluid.  Scattered stool. Degenerative changes of the spine and pelvis. Areas of sclerosis along both sacroiliac joints as well as along the right ischial region of uncertain etiology and significance. Please correlate with any known history and previous MRI evaluation. Previous ovarian cystic lesion no longer identified Electronically Signed   By: Karen Kays M.D.   On: 07/21/2023 14:41   PROCEDURES: Critical Care performed: No Procedures MEDICATIONS ORDERED IN ED: Medications  sodium chloride 0.9 % bolus 1,000 mL (1,000 mLs Intravenous New Bag/Given 07/21/23 1325)  morphine (PF) 4 MG/ML injection 4 mg (4 mg Intravenous Given 07/21/23 1326)  iohexol (OMNIPAQUE) 300 MG/ML solution 100 mL (100 mLs Intravenous Contrast Given 07/21/23 1348)   IMPRESSION / MDM / ASSESSMENT AND PLAN / ED COURSE  I reviewed the triage vital signs and the nursing notes.                             The patient is on the cardiac monitor to evaluate for evidence of arrhythmia and/or significant heart  rate changes. Patient's presentation is most consistent with acute presentation with potential threat to life or bodily function. Patient presents for low back pain. Given History and Exam the patient appears to be at low risk for Spinal Cord Compression Syndrome, Vertebral Malignancy/Mets, acute Spinal Fracture, Vertebral Osteomyelitis, Epidural Abscess, Infected or Obstructing Kidney Stone.  Their presentation appears most likely to be secondary to non-emergent musculoskeletal etiology vs non-emergent disc herniation.  ED Workup: CT of the abdomen and pelvis did not show any acute abnormalities and only shows chronic degenerative back changes as well as sclerosis over bilateral sacroiliac crest Patient states that she will follow-up with OB/GYN for  vaginal bleeding Disposition: Discharge. Strict return precautions discussed with patient with full understanding. Advised patient to follow up promptly with primary care provider   FINAL CLINICAL IMPRESSION(S) / ED DIAGNOSES   Final diagnoses:  Bulge of lumbar disc without myelopathy  Left lower quadrant abdominal pain   Rx / DC Orders   ED Discharge Orders          Ordered    AMB Referral VBCI Care Management       Comments: Pt does not currently have a phone and asked to call the friend she is staying with at [phone] or to email her at [email or web address]   07/21/23 1529    Ambulatory Referral to Primary Care (Establish Care)        07/21/23 1529    predniSONE (STERAPRED UNI-PAK 21 TAB) 10 MG (21) TBPK tablet        07/21/23 1531           Note:  This document was prepared using Dragon voice recognition software and may include unintentional dictation errors.   Merwyn Katos, MD 07/21/23 [phone]

## 2023-07-21 NOTE — ED Triage Notes (Signed)
Arrives from [facility] for evaluation of vaginal bleeding.  Also c/o pelvic pain.

## 2023-07-21 NOTE — ED Triage Notes (Addendum)
Patient presents with left sided pelvic pain and lower, left back pain. Pain started a week ago and has worsened.   Menstrual cycle beginning of January and now reports red and brown vaginal bleeding.   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

## 2023-07-21 NOTE — ED Notes (Signed)
Pt verbalizes understanding of discharge instructions. Opportunity for questioning and answers were provided. Pt discharged from ED to home.   ? ?

## 2023-08-02 ENCOUNTER — Telehealth: Payer: Self-pay | Admitting: Neurosurgery

## 2023-08-02 NOTE — Telephone Encounter (Signed)
  Lovenia Kim, MD  Merwyn Katos, MD; P [facility]-Neurosurgery Admin Thanks Dr. Vicente Males, our team will get her in to see [place].  Clinic: okay for APP Timeline: 2-4 weeks Tests to order: Upright lumbar flex ex.    Rockey Situ 08/02/2023 08:19 AM EST Left message to call back ------------------------------------ Rowe Pavy 07/28/2023 03:42 PM EST Left message to call our office back.  ------------------------------------ Karmen Stabs F 07/25/2023 08:05 AM EST Left message to call our office back.

## 2023-08-05 NOTE — Telephone Encounter (Signed)
Patient called back she is scheduled for 08/22/2023 at 10am and xrays at 9am.

## 2023-08-19 NOTE — Progress Notes (Addendum)
 Referring Physician:  No referring provider defined for this encounter.  Primary Physician:  Andrea Cain, Andrea Cain  History of Present Illness: 08/22/23 Andrea Cain is here today with a chief complaint of low back pain that radiates down bilateral lower extremities, left worse than right.  She feels as though it radiates all the way down to her toes.  It is exacerbated by sitting and laying down without pressure on her back and tailbone is likely that helps.  Is been ongoing for quite some time, but did have an injury affecting the tailbone in the past and he is already in the exacerbation of those symptoms previous experience.  She has been taking gabapentin and Flexeril as well as using recreational drugs.  She denies any weakness, saddle anesthesia, changes to gait.  Andrea Cain has no symptoms of cervical myelopathy.  The symptoms are causing a significant impact on the patient's life.   Review of Systems:  A 10 point review of systems is negative, except for the pertinent positives and negatives detailed in the HPI.  Past Medical History: Past Medical History:  Diagnosis Date   ADHD (attention deficit hyperactivity disorder)    Anxiety    Asthma    Depression    Hepatitis    Hepatitis C    Hypertension     Past Surgical History: Past Surgical History:  Procedure Laterality Date   ESOPHAGOGASTRODUODENOSCOPY (EGD) WITH PROPOFOL N/A 10/06/2015   Procedure: ESOPHAGOGASTRODUODENOSCOPY (EGD) WITH PROPOFOL;  Surgeon: Andrea Maxwell, MD;  Location: [facility] ENDOSCOPY;  Service: Endoscopy;  Laterality: N/A;    Allergies: Allergies as of 08/22/2023 - Review Complete 08/22/2023  Allergen Reaction Noted   Codeine  10/03/2015    Medications: Outpatient Encounter Medications as of 08/22/2023  Medication Sig   Accu-Chek Softclix Lancets lancets 1 each as needed   albuterol (PROVENTIL HFA;VENTOLIN HFA) 108 (90 Base) MCG/ACT inhaler Inhale 2 puffs into  the lungs every 6 (six) hours as needed for wheezing or shortness of breath.   cyclobenzaprine (FLEXERIL) 10 MG tablet Take 10 mg by mouth as directed.   gabapentin (NEURONTIN) 300 MG capsule Take 300 mg by mouth 3 (three) times daily.   hydrochlorothiazide (HYDRODIURIL) 25 MG tablet Take 25 mg by mouth daily.   naproxen (NAPROSYN) 250 MG tablet Take by mouth 2 (two) times daily with a meal. (Patient not taking: Reported on 08/22/2023)   potassium chloride (KLOR-CON) 10 MEQ tablet Take 1 tablet (10 mEq total) by mouth daily. (Patient not taking: Reported on 08/22/2023)   [DISCONTINUED] predniSONE (STERAPRED UNI-PAK 21 TAB) 10 MG (21) TBPK tablet As directed on packaging   No facility-administered encounter medications on file as of 08/22/2023.    Social History: Social History   Tobacco Use   Smoking status: Every Day    Current packs/day: 0.50    Types: Cigarettes   Smokeless tobacco: Never  Vaping Use   Vaping status: Never Used  Substance Use Topics   Alcohol use: Yes    Alcohol/week: 12.0 standard drinks of alcohol    Types: 12 Cans of beer per week    Comment: 2 40's almost every day   Drug use: Yes    Types: Marijuana, Cocaine, Methamphetamines    Comment: marijuana daily, cocaine sometimes   Currently smoking meth.  Family Medical History: History reviewed. No pertinent family history.  Physical Examination: @VITALWITHPAIN @  General: Patient is well developed, well nourished, calm, collected, and in no apparent distress. Attention to examination  is appropriate.  Psychiatric: Patient is non-anxious.  Head:  Pupils equal, round, and reactive to light.  ENT:  Oral mucosa appears well hydrated.  Neck:   Supple.  Full range of motion.  Respiratory: Patient is breathing without any difficulty.  Extremities: No edema.  Vascular: Palpable dorsal pedal pulses.  Skin:   On exposed skin, there are no abnormal skin lesions.  NEUROLOGICAL:     Awake, alert, oriented to  person, place, and time.  Speech is clear and fluent. Fund of knowledge is appropriate.   Cranial Nerves: Pupils equal round and reactive to light.  Facial tone is symmetric.    Some tenderness to her lumbar paraspinals and sacrum area.  Negative straight leg raise.  Strength:  Side Iliopsoas Quads Hamstring PF DF EHL  R 5 5 5 5 5 5   L 5 5 5 5 5 5    Reflexes are 2+ patella and achilles.   Clonus is not present.  Toes are down-going.  Bilateral upper and lower extremity sensation is intact to light touch.    Gait is normal.   No difficulty with tandem gait.   No evidence of dysmetria noted.  Medical Decision Making  Imaging: MRI sacrum/SI joint 05/09/2023: IMPRESSION: 1. 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 No current marrow edema identified in the sacrum to suggest acute or subacute fracture. 2. Degenerative facet arthropathy L4-5 and L5-S1. 3. Mild bilateral proximal hamstring tendinopathy. 4. Small amount of free pelvic fluid. 5. Despite efforts by the technologist and patient, extensive motion artifact is present on today's exam and could not be eliminated. This reduces exam sensitivity and specificity.   I have personally reviewed the images and agree with the above interpretation.  Assessment and Plan: [name] is a pleasant 48 y.o. female is here today with a chief complaint of low back pain that radiates down bilateral lower extremities, left worse than right.  She feels as though it radiates all the way down to her toes.  It is exacerbated by sitting and laying down without pressure on her back and tailbone is likely that helps.  Is been ongoing for quite some time, but did have an injury affecting the tailbone in the past and he is already  in the exacerbation of those symptoms previous experience.  She has been taking gabapentin and Flexeril as well as using recreational drugs.  She denies any weakness, saddle anesthesia, changes to gait.  On examination, some tenderness to her lumbar paraspinals and sacrum.  Negative straight leg raise.  She is full strength of bilateral lower extremities.  MRI of her sacrum was reviewed which shows potential sacral insufficiency fractures versus chronic sacroiliac arthropathy.  She does have degenerative facet arthropathy extending from L4-S1 as well.  It was a pleasure to see patient in clinic today.  We did discuss her drug use and encouragement of sober lifestyle.  Asked her if she needed resources to pursue this.  She plans to move to [place] tomorrow which does complicate her care.  She does not intend to stay in the area past tomorrow and would like to have her records sent to the new facility when she arrives.  I instructed her that if this were to fall through her would not be the case we are happy to continue her  care.  Would suggest that she move forward with physical therapy as well as potential SI joint injections with our pain team.  If any of this were to change she was encouraged to reach out to me.  We discussed over-the-counter medications and optimizing them.  Thank you for involving me in the care of this patient.     Joan Flores, PA-C Dept. of Neurosurgery

## 2023-08-22 ENCOUNTER — Encounter: Payer: Self-pay | Admitting: Physician Assistant

## 2023-08-22 ENCOUNTER — Ambulatory Visit
Admission: RE | Admit: 2023-08-22 | Discharge: 2023-08-22 | Disposition: A | Discharge status: Home / Self Care | Payer: MEDICAID | Source: Ambulatory Visit | Attending: Physician Assistant | Admitting: Physician Assistant

## 2023-08-22 ENCOUNTER — Other Ambulatory Visit: Payer: Self-pay

## 2023-08-22 ENCOUNTER — Ambulatory Visit (INDEPENDENT_AMBULATORY_CARE_PROVIDER_SITE_OTHER): Payer: MEDICAID | Admitting: Physician Assistant

## 2023-08-22 ENCOUNTER — Ambulatory Visit
Admission: RE | Admit: 2023-08-22 | Discharge: 2023-08-22 | Disposition: A | Discharge status: Home / Self Care | Payer: MEDICAID | Attending: Physician Assistant | Admitting: Physician Assistant

## 2023-08-22 VITALS — BP 142/96 | Ht 65.0 in | Wt 214.0 lb

## 2023-08-22 DIAGNOSIS — M8448XG Pathological fracture, other site, subsequent encounter for fracture with delayed healing: Secondary | ICD-10-CM

## 2023-08-22 DIAGNOSIS — M545 Low back pain, unspecified: Secondary | ICD-10-CM | POA: Insufficient documentation

## 2023-08-22 DIAGNOSIS — R200 Anesthesia of skin: Secondary | ICD-10-CM

## 2023-08-22 DIAGNOSIS — M5442 Lumbago with sciatica, left side: Secondary | ICD-10-CM

## 2023-08-22 DIAGNOSIS — M5441 Lumbago with sciatica, right side: Secondary | ICD-10-CM | POA: Diagnosis not present

## 2023-08-22 DIAGNOSIS — G8929 Other chronic pain: Secondary | ICD-10-CM

## 2023-08-22 DIAGNOSIS — F1990 Other psychoactive substance use, unspecified, uncomplicated: Secondary | ICD-10-CM

## 2023-08-22 DIAGNOSIS — R202 Paresthesia of skin: Secondary | ICD-10-CM

## 2023-09-14 ENCOUNTER — Emergency Department: Admission: EM | Admit: 2023-09-14 | Discharge: 2023-09-14 | Discharge status: Against Medical Advice | Payer: MEDICAID

## 2023-09-14 NOTE — ED Notes (Signed)
 Called x3 with no answer in the lobby for triage.

## 2023-09-15 ENCOUNTER — Other Ambulatory Visit: Payer: Self-pay

## 2023-09-15 ENCOUNTER — Emergency Department: Payer: MEDICAID

## 2023-09-15 ENCOUNTER — Encounter: Payer: Self-pay | Admitting: *Deleted

## 2023-09-15 ENCOUNTER — Emergency Department
Admission: EM | Admit: 2023-09-15 | Discharge: 2023-09-15 | Disposition: A | Discharge status: Home / Self Care | Payer: MEDICAID | Attending: Emergency Medicine | Admitting: Emergency Medicine

## 2023-09-15 DIAGNOSIS — J45909 Unspecified asthma, uncomplicated: Secondary | ICD-10-CM | POA: Insufficient documentation

## 2023-09-15 DIAGNOSIS — N924 Excessive bleeding in the premenopausal period: Secondary | ICD-10-CM

## 2023-09-15 DIAGNOSIS — M25561 Pain in right knee: Secondary | ICD-10-CM | POA: Diagnosis present

## 2023-09-15 DIAGNOSIS — N939 Abnormal uterine and vaginal bleeding, unspecified: Secondary | ICD-10-CM | POA: Insufficient documentation

## 2023-09-15 DIAGNOSIS — W1842XA Slipping, tripping and stumbling without falling due to stepping into hole or opening, initial encounter: Secondary | ICD-10-CM | POA: Insufficient documentation

## 2023-09-15 DIAGNOSIS — I10 Essential (primary) hypertension: Secondary | ICD-10-CM | POA: Diagnosis not present

## 2023-09-15 DIAGNOSIS — G8929 Other chronic pain: Secondary | ICD-10-CM | POA: Diagnosis not present

## 2023-09-15 MED ORDER — NAPROXEN 500 MG PO TABS
500.0000 mg | ORAL_TABLET | Freq: Once | ORAL | Status: AC
  Administered 2023-09-15: 500 mg via ORAL
  Filled 2023-09-15: qty 1

## 2023-09-15 MED ORDER — NAPROXEN 500 MG PO TABS: 500.0000 mg | ORAL_TABLET | Freq: Two times a day (BID) | ORAL | 2 refills | Status: AC

## 2023-09-15 MED ORDER — MEDROXYPROGESTERONE ACETATE 5 MG PO TABS: 5.0000 mg | ORAL_TABLET | Freq: Every day | ORAL | 0 refills | Status: AC

## 2023-09-15 MED ORDER — GABAPENTIN 300 MG PO CAPS
300.0000 mg | ORAL_CAPSULE | Freq: Four times a day (QID) | ORAL | 0 refills | Status: AC
Start: 2023-09-15 — End: 2023-10-15

## 2023-09-15 NOTE — ED Provider Notes (Signed)
 [facility] Provider Note    Event Date/Time   First MD Initiated Contact with Patient 09/15/23 1203     (approximate)   History   Knee Pain   HPI  Andrea Cain is a 48 y.o. female history of hypertension, hep C, asthma and depression presents emergency department complaint of right knee pain.  Patient has chronic problems with the right knee but then stepped in a hole and twisted the knee causing increased pain.  States pain and swelling is increased.  Patient is also complaining of heavy vaginal bleeding due to ongoing multiple periods through the last month.  States she continues to go through pads, not more than 1/h.  States the blood is more of a dark brown.  Does not feel weak      Physical Exam   Triage Vital Signs: ED Triage Vitals [09/15/23 1138]  Encounter Vitals Group     BP (!) 144/105     Systolic BP Percentile      Diastolic BP Percentile      Pulse Rate (!) 103     Resp 16     Temp 98.4 F (36.9 C)     Temp Source Oral     SpO2 95 %     Weight      Height      Head Circumference      Peak Flow      Pain Score      Pain Loc      Pain Education      Exclude from Growth Chart     Most recent vital signs: Vitals:   09/15/23 1138  BP: (!) 144/105  Pulse: (!) 103  Resp: 16  Temp: 98.4 F (36.9 C)  SpO2: 95%     General: Awake, no distress.   CV:  Good peripheral perfusion. regular rate and  rhythm Resp:  Normal effort.  Abd:  No distention.   Other:  Right knee swollen tender to palpation at joint line, full range of motion, neurovascular intact   ED Results / Procedures / Treatments   Labs (all labs ordered are listed, but only abnormal results are displayed) Labs Reviewed - No data to display   EKG     RADIOLOGY X-ray of the right knee    PROCEDURES:   Procedures Chief Complaint  Patient presents with   Knee Pain      MEDICATIONS ORDERED IN ED: Medications  naproxen (NAPROSYN) tablet  500 mg (500 mg Oral Given 09/15/23 1252)     IMPRESSION / MDM / ASSESSMENT AND PLAN / ED COURSE  I reviewed the triage vital signs and the nursing notes.                              Differential diagnosis includes, but is not limited to, contusion, fracture, sprain, chronic pain, dysfunctional uterine bleeding, abnormal uterine bleeding, perimenopausal  Patient's presentation is most consistent with acute illness / injury with system symptoms.   X-ray of the right knee independently reviewed interpreted by me as being negative for any acute abnormality  I did discuss the vaginal bleeding with the patient.  Basically she started fiber in her period that she has had ongoing for close to 1 month.  Will go ahead and start her on Provera to stop the vaginal bleeding.  She would also like to have her gabapentin increased as she that does help her  with her chronic pain.  Gave her a prescription for gabapentin 300 mg 4 times daily.  She can follow-up with her regular doctor concerning this medication.  Started her on Naprosyn for the knee pain.  We placed her in a knee immobilizer.  She is to elevate and ice.  Follow-up with orthopedics.  She is in agreement with treatment plan.  Discharged stable condition.      FINAL CLINICAL IMPRESSION(S) / ED DIAGNOSES   Final diagnoses:  Acute pain of right knee  Chronic pain of right knee  Abnormal vaginal bleeding in premenopausal patient     Rx / DC Orders   ED Discharge Orders          Ordered    medroxyPROGESTERone (PROVERA) 5 MG tablet  Daily        09/15/23 1246    naproxen (NAPROSYN) 500 MG tablet  2 times daily with meals        09/15/23 1246    gabapentin (NEURONTIN) 300 MG capsule  4 times daily        09/15/23 1246             Note:  This document was prepared using Dragon voice recognition software and may include unintentional dictation errors.    Faythe Ghee, PA-C 09/15/23 1709    Minna Antis, MD 09/19/23  [phone]

## 2023-09-15 NOTE — ED Notes (Signed)
 See triage notes. Patient c/o injuring her right knee after stepping a hole. Patient also c/o passing brown blood vaginally (for a few months and has been referred to obstetrics) and pain in her left flank.

## 2023-09-15 NOTE — ED Triage Notes (Signed)
 Pt is here for right knee pain.  Pt states that she has had knee pain for years but she stepping a hole yesterday and the pain is increased and swelling is increased.

## 2023-10-11 ENCOUNTER — Ambulatory Visit (INDEPENDENT_AMBULATORY_CARE_PROVIDER_SITE_OTHER): Payer: MEDICAID | Admitting: Obstetrics & Gynecology

## 2023-10-11 ENCOUNTER — Encounter: Payer: Self-pay | Admitting: Obstetrics & Gynecology

## 2023-10-11 ENCOUNTER — Other Ambulatory Visit (HOSPITAL_COMMUNITY)
Admission: RE | Admit: 2023-10-11 | Discharge: 2023-10-11 | Disposition: A | Discharge status: Home / Self Care | Payer: MEDICAID | Source: Ambulatory Visit | Attending: Obstetrics & Gynecology | Admitting: Obstetrics & Gynecology

## 2023-10-11 VITALS — BP 139/89 | HR 97 | Ht 65.0 in | Wt 219.0 lb

## 2023-10-11 DIAGNOSIS — Z113 Encounter for screening for infections with a predominantly sexual mode of transmission: Secondary | ICD-10-CM

## 2023-10-11 DIAGNOSIS — B1920 Unspecified viral hepatitis C without hepatic coma: Secondary | ICD-10-CM | POA: Diagnosis not present

## 2023-10-11 DIAGNOSIS — N898 Other specified noninflammatory disorders of vagina: Secondary | ICD-10-CM

## 2023-10-11 DIAGNOSIS — N921 Excessive and frequent menstruation with irregular cycle: Secondary | ICD-10-CM

## 2023-10-11 DIAGNOSIS — Z124 Encounter for screening for malignant neoplasm of cervix: Secondary | ICD-10-CM

## 2023-10-11 DIAGNOSIS — Z1231 Encounter for screening mammogram for malignant neoplasm of breast: Secondary | ICD-10-CM

## 2023-10-11 NOTE — Progress Notes (Signed)
    GYNECOLOGY PROGRESS NOTE  Subjective:    Patient ID: Andrea Cain, female    DOB: 1976/05/02, 48 y.o.   MRN: [id number]  HPI  Patient is a 48 y.o. single P here as a new patient with the issue of 2 periods per month for several months. She uses BTL as contraception. She has been monogamous for about a year. She has Hep C but has not had any recent testing. She wants testing for STIs, including HSV.  The following portions of the patient's history were reviewed and updated as appropriate: allergies, current medications, past family history, past medical history, past social history, past surgical history, and problem list.  Review of Systems Pertinent items are noted in HPI.  She was recently incarcerated. Objective:   Blood pressure 139/89, pulse 97, height 5\' 5"  (1.651 m), weight 219 lb (99.3 kg), last menstrual period 09/13/2023. Body mass index is 36.44 kg/m. EG- normal Pederson speculum used, voluntary guarding noted Non-descript white discharge noted, no odor Nabothian cysts noted on cervix Bimanual exam - no masses appreciated but exam limited by her centripital obesity   Assessment:   1. Screening for cervical cancer   2. Vaginal discharge   3. Menometrorrhagia   4. Routine screening for STI (sexually transmitted infection)   5. Hepatitis C virus infection without hepatic coma, unspecified chronicity      Plan:   1. Screening for cervical cancer (Primary)  - Cytology - PAP  2. Vaginal discharge  - Cervicovaginal ancillary only  3. Menometrorrhagia - pelvic ultrasound - check TSH, free T4  4. Routine screening for STI (sexually transmitted infection)  - Hepatitis B surface antigen - Hepatitis C Antibody - Hepatitis C RNA quantitative - HIV antibody (with reflex) - HSV 1 and 2 Ab, IgG  5. Hepatitis C virus infection without hepatic coma, unspecified chronicity  - Hepatitis C RNA quantitative  She will come back when pelvic ultrasound results are  available.

## 2023-10-13 ENCOUNTER — Ambulatory Visit
Admission: RE | Admit: 2023-10-13 | Discharge: 2023-10-13 | Disposition: A | Discharge status: Home / Self Care | Payer: MEDICAID | Source: Ambulatory Visit | Attending: Obstetrics & Gynecology | Admitting: Obstetrics & Gynecology

## 2023-10-13 DIAGNOSIS — N921 Excessive and frequent menstruation with irregular cycle: Secondary | ICD-10-CM | POA: Insufficient documentation

## 2023-10-13 LAB — HSV 1 AND 2 AB, IGG
HSV 1 Glycoprotein G Ab, IgG: REACTIVE — AB
HSV 2 IgG, Type Spec: REACTIVE — AB

## 2023-10-13 LAB — CERVICOVAGINAL ANCILLARY ONLY
Bacterial Vaginitis (gardnerella): POSITIVE — AB
Candida Glabrata: NEGATIVE
Candida Vaginitis: NEGATIVE
Comment: NEGATIVE
Comment: NEGATIVE
Comment: NEGATIVE
Comment: NEGATIVE
Trichomonas: POSITIVE — AB

## 2023-10-13 LAB — HEPATITIS C ANTIBODY: Hep C Virus Ab: REACTIVE — AB

## 2023-10-13 LAB — HEPATITIS B SURFACE ANTIGEN: Hepatitis B Surface Ag: NEGATIVE

## 2023-10-13 LAB — TSH+FREE T4
Free T4: 0.94 ng/dL (ref 0.82–1.77)
TSH: 1.01 u[IU]/mL (ref 0.450–4.500)

## 2023-10-13 LAB — HIV ANTIBODY (ROUTINE TESTING W REFLEX): HIV Screen 4th Generation wRfx: NONREACTIVE

## 2023-10-14 LAB — CYTOLOGY - PAP
Chlamydia: NEGATIVE
Comment: NEGATIVE
Comment: NEGATIVE
Comment: NORMAL
Diagnosis: NEGATIVE
High risk HPV: NEGATIVE
Neisseria Gonorrhea: NEGATIVE

## 2023-10-17 ENCOUNTER — Encounter: Payer: Self-pay | Admitting: Obstetrics & Gynecology

## 2023-10-17 ENCOUNTER — Other Ambulatory Visit: Payer: Self-pay | Admitting: Obstetrics & Gynecology

## 2023-10-17 DIAGNOSIS — B9689 Other specified bacterial agents as the cause of diseases classified elsewhere: Secondary | ICD-10-CM

## 2023-10-17 DIAGNOSIS — A599 Trichomoniasis, unspecified: Secondary | ICD-10-CM

## 2023-10-17 MED ORDER — METRONIDAZOLE 500 MG PO TABS: 500.0000 mg | ORAL_TABLET | Freq: Two times a day (BID) | ORAL | 1 refills | Status: AC

## 2023-10-17 NOTE — Progress Notes (Signed)
Flagyl prescribed to treat trich and bv.

## 2023-10-28 ENCOUNTER — Ambulatory Visit (INDEPENDENT_AMBULATORY_CARE_PROVIDER_SITE_OTHER): Payer: MEDICAID | Admitting: Physician Assistant

## 2023-10-28 ENCOUNTER — Encounter: Payer: Self-pay | Admitting: Physician Assistant

## 2023-10-28 ENCOUNTER — Other Ambulatory Visit: Payer: Self-pay | Admitting: Sports Medicine

## 2023-10-28 VITALS — BP 126/78 | Ht 65.0 in | Wt 219.0 lb

## 2023-10-28 DIAGNOSIS — F1990 Other psychoactive substance use, unspecified, uncomplicated: Secondary | ICD-10-CM

## 2023-10-28 DIAGNOSIS — M8448XG Pathological fracture, other site, subsequent encounter for fracture with delayed healing: Secondary | ICD-10-CM | POA: Diagnosis not present

## 2023-10-28 DIAGNOSIS — G8929 Other chronic pain: Secondary | ICD-10-CM

## 2023-10-28 DIAGNOSIS — M545 Low back pain, unspecified: Secondary | ICD-10-CM

## 2023-10-28 DIAGNOSIS — R200 Anesthesia of skin: Secondary | ICD-10-CM

## 2023-10-28 DIAGNOSIS — M5441 Lumbago with sciatica, right side: Secondary | ICD-10-CM | POA: Diagnosis not present

## 2023-10-28 DIAGNOSIS — S8991XA Unspecified injury of right lower leg, initial encounter: Secondary | ICD-10-CM

## 2023-10-28 DIAGNOSIS — M5442 Lumbago with sciatica, left side: Secondary | ICD-10-CM | POA: Diagnosis not present

## 2023-10-28 NOTE — Progress Notes (Unsigned)
 Referring Physician:  [facility] [address] [place],  [place] [address]  Primary Physician:  [facility]  History of Present Illness: 08/22/23 Andrea Cain is here today with a chief complaint of low back pain that radiates down bilateral lower extremities, left worse than right.  She feels as though it radiates all the way down to her toes.  It is exacerbated by sitting and laying down without pressure on her back and tailbone is likely that helps.  Is been ongoing for quite some time, but did have an injury affecting the tailbone in the past and he is already in the exacerbation of those symptoms previous experience.  She has been taking gabapentin  and Flexeril as well as using recreational drugs.  She denies any weakness, saddle anesthesia, changes to gait.  10/28/23 Patient seen again in clinic as follow-up.  When she was last seen approximately 3 months ago she was planning on moving to [place]  at the very next day however she is now in [place] again.  She states she continues to have low back pain that radiates down both of her legs left worse than right.  She states it hurts when she sitting or laying down or up trying to walk.  She denies any weakness, saddle anesthesia.  She feels as though she might be falling more than usual and she is unsure why.  The symptoms are causing a significant impact on the patient's life.   Review of Systems:  A 10 point review of systems is negative, except for the pertinent positives and negatives detailed in the HPI.  Past Medical History: Past Medical History:  Diagnosis Date   ADHD (attention deficit hyperactivity disorder)    Anxiety    Asthma    Depression    Hepatitis    Hepatitis C    Hypertension     Past Surgical History: Past Surgical History:  Procedure Laterality Date   ESOPHAGOGASTRODUODENOSCOPY (EGD) WITH PROPOFOL  N/A 10/06/2015   Procedure:  ESOPHAGOGASTRODUODENOSCOPY (EGD) WITH PROPOFOL ;  Surgeon: Luella Sager, MD;  Location: [facility] ENDOSCOPY;  Service: Endoscopy;  Laterality: N/A;    Allergies: Allergies as of 10/28/2023 - Review Complete 10/28/2023  Allergen Reaction Noted   Codeine  10/03/2015    Medications: Outpatient Encounter Medications as of 10/28/2023  Medication Sig   Accu-Chek Softclix Lancets lancets    albuterol  (PROVENTIL  HFA;VENTOLIN  HFA) 108 (90 Base) MCG/ACT inhaler Inhale 2 puffs into the lungs every 6 (six) hours as needed for wheezing or shortness of breath.   cyclobenzaprine (FLEXERIL) 10 MG tablet Take 10 mg by mouth as directed.   hydrochlorothiazide (HYDRODIURIL) 25 MG tablet Take 25 mg by mouth daily.   medroxyPROGESTERone  (PROVERA ) 5 MG tablet Take 1 tablet (5 mg total) by mouth daily.   metroNIDAZOLE  (FLAGYL ) 500 MG tablet Take 1 tablet (500 mg total) by mouth 2 (two) times daily.   naproxen  (NAPROSYN ) 500 MG tablet Take 1 tablet (500 mg total) by mouth 2 (two) times daily with a meal.   potassium chloride  (KLOR-CON ) 10 MEQ tablet Take 1 tablet (10 mEq total) by mouth daily.   gabapentin  (NEURONTIN ) 300 MG capsule Take 1 capsule (300 mg total) by mouth 4 (four) times daily.   No facility-administered encounter medications on file as of 10/28/2023.    Social History: Social History   Tobacco Use   Smoking status: Every Day    Current packs/day: 0.50    Types: Cigarettes  Smokeless tobacco: Never  Vaping Use   Vaping status: Never Used  Substance Use Topics   Alcohol use: Yes    Alcohol/week: 12.0 standard drinks of alcohol    Types: 12 Cans of beer per week    Comment: 2 40's almost every day   Drug use: Yes    Types: Marijuana, Methamphetamines    Comment: marijuana daily,   Currently smoking meth.  Family Medical History: History reviewed. No pertinent family history.  Physical Examination: @VITALWITHPAIN @  General: Patient is well developed, well nourished, calm,  collected, and in no apparent distress. Attention to examination is appropriate.  Psychiatric: Patient is non-anxious.  Head:  Pupils equal, round, and reactive to light.  ENT:  Oral mucosa appears well hydrated.  Neck:   Supple.  Full range of motion.  Respiratory: Patient is breathing without any difficulty.  Extremities: No edema.  Vascular: Palpable dorsal pedal pulses.  Skin:   On exposed skin, there are no abnormal skin lesions.  NEUROLOGICAL:     Awake, alert, oriented to person, place, and time.  Speech is clear and fluent. Fund of knowledge is appropriate.   Cranial Nerves: Pupils equal round and reactive to light.  Facial tone is symmetric.    Some tenderness to her lumbar paraspinals and sacrum area.  Negative straight leg raise.  Strength:  Side Iliopsoas Quads Hamstring PF DF EHL  R 5 5 5 5 5 5   L 5 5 5 5 5 5    Reflexes are 2+ patella and achilles.   Clonus is not present.  Toes are down-going.  Bilateral upper and lower extremity sensation is intact to light touch.    Gait is normal.   No difficulty with tandem gait.   No evidence of dysmetria noted.  Medical Decision Making  Imaging: MRI sacrum/SI joint 05/09/2023: IMPRESSION: 1. 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 No current marrow edema identified in the sacrum to suggest acute or subacute fracture. 2. Degenerative facet arthropathy L4-5 and L5-S1. 3. Mild bilateral proximal hamstring tendinopathy. 4. Small amount of free pelvic fluid. 5. Despite efforts by the technologist and patient, extensive motion artifact is present on today's exam and could not be eliminated. This reduces exam sensitivity and specificity.  CLINICAL DATA:  Low back pain radiating to the left  leg   EXAM: LUMBAR SPINE - COMPLETE 4+ VIEW   COMPARISON:  Lumbar spine MRI November 24, 2022   FINDINGS: Mild degenerative disc disease T12-L1. Degenerative disc disease L4-L5. Degenerative facet disease L4-L5 and L5-S1   IMPRESSION: Degenerative disc disease and degenerative facet disease. No fractures or other bony abnormalities.   I have personally reviewed the images and agree with the above interpretation.  Assessment and Plan: Ms. Downum is a pleasant 48 y.o. female seen again in clinic as follow-up for back pain radiating to bilateral lower extremities.  When she was last seen approximately 3 months ago she was planning on moving to [place]  at the very next day however she is now in [place] again.  She states she continues to have low back pain that radiates down both of her legs left worse than right.  She states it hurts when she sitting or laying down or up trying to walk.  She denies any weakness, saddle anesthesia.  She feels as though  she might be falling more than usual and she is unsure why.  She is full strength of bilateral lower extremities.  MRI of her sacrum was reviewed which shows potential sacral insufficiency fractures versus chronic sacroiliac arthropathy.  She does have degenerative facet arthropathy extending from L4-S1 as well.  It was a pleasure to see patient in clinic today.  We did discuss her drug use and encouragement of sober lifestyle.  Now that she is in the area we will resend referrals locally.  Referrals plan to be sent to physical therapy and to our pain colleagues for potential injections.  Plan to see back in approximately 3 months or as needed sooner.  I encouraged her to optimize her over-the-counter medications.    Thank you for involving me in the care of this patient.     Ludwig Safer, PA-C Dept. of Neurosurgery

## 2023-10-31 ENCOUNTER — Encounter: Payer: Self-pay | Admitting: Sports Medicine

## 2023-11-05 ENCOUNTER — Ambulatory Visit
Admission: RE | Admit: 2023-11-05 | Discharge: 2023-11-05 | Disposition: A | Discharge status: Home / Self Care | Payer: MEDICAID | Source: Ambulatory Visit | Attending: Sports Medicine

## 2023-11-05 DIAGNOSIS — S8991XA Unspecified injury of right lower leg, initial encounter: Secondary | ICD-10-CM
